# Patient Record
Sex: Female | Born: 1940 | Race: White | Hispanic: No | Marital: Married | Smoking: Never smoker
Health system: Southern US, Community
[De-identification: ages and names within clinical notes are randomized; demographics above are authoritative.]

## PROBLEM LIST (undated history)

## (undated) DIAGNOSIS — R569 Unspecified convulsions: Secondary | ICD-10-CM

## (undated) DIAGNOSIS — F0390 Unspecified dementia without behavioral disturbance: Secondary | ICD-10-CM

## (undated) HISTORY — DX: Unspecified dementia, unspecified severity, without behavioral disturbance, psychotic disturbance, mood disturbance, and anxiety: F03.90

## (undated) HISTORY — PX: BACK SURGERY: SHX140

---

## 2002-11-28 ENCOUNTER — Emergency Department (HOSPITAL_COMMUNITY): Admission: EM | Admit: 2002-11-28 | Discharge: 2002-11-28 | Payer: Self-pay | Admitting: Emergency Medicine

## 2010-09-18 ENCOUNTER — Encounter: Payer: Self-pay | Admitting: Internal Medicine

## 2016-08-14 ENCOUNTER — Ambulatory Visit (INDEPENDENT_AMBULATORY_CARE_PROVIDER_SITE_OTHER): Payer: Medicare HMO | Admitting: Neurology

## 2016-08-14 ENCOUNTER — Encounter: Payer: Self-pay | Admitting: Neurology

## 2016-08-14 VITALS — BP 129/65 | HR 79 | Ht 64.0 in | Wt 149.2 lb

## 2016-08-14 DIAGNOSIS — R410 Disorientation, unspecified: Secondary | ICD-10-CM

## 2016-08-14 DIAGNOSIS — E538 Deficiency of other specified B group vitamins: Secondary | ICD-10-CM

## 2016-08-14 DIAGNOSIS — F0281 Dementia in other diseases classified elsewhere with behavioral disturbance: Secondary | ICD-10-CM | POA: Diagnosis not present

## 2016-08-14 DIAGNOSIS — R413 Other amnesia: Secondary | ICD-10-CM

## 2016-08-14 DIAGNOSIS — F0391 Unspecified dementia with behavioral disturbance: Secondary | ICD-10-CM | POA: Diagnosis not present

## 2016-08-14 DIAGNOSIS — F05 Delirium due to known physiological condition: Secondary | ICD-10-CM | POA: Diagnosis not present

## 2016-08-14 DIAGNOSIS — G301 Alzheimer's disease with late onset: Secondary | ICD-10-CM | POA: Diagnosis not present

## 2016-08-14 MED ORDER — ALPRAZOLAM 0.5 MG PO TABS: ORAL_TABLET | ORAL | 0 refills | Status: DC

## 2016-08-14 MED ORDER — DONEPEZIL HCL 10 MG PO TABS: 10.0000 mg | ORAL_TABLET | Freq: Every day | ORAL | 6 refills | Status: DC

## 2016-08-14 NOTE — Patient Instructions (Addendum)
Remember to drink plenty of fluid, eat healthy meals and do not skip any meals. Try to eat protein with a every meal and eat a healthy snack such as fruit or nuts in between meals. Try to keep a regular sleep-wake schedule and try to exercise daily, particularly in the form of walking, 20-30 minutes a day, if you can.   As far as your medications are concerned, I would like to suggest: Aricept. Start with 1/2 tablet an dthen ibcrease to a whole tablet in 1 week if no side effects  As far as diagnostic testing: Labs, MRI brain  I would like to see you back in 4 months, sooner if we need to. Please call us with any interim questions, concerns, problems, updates or refill requests.   Our phone number is [phone]. We also have an after hours call service for urgent matters and there is a physician on-call for urgent questions. For any emergencies you know to call 911 or go to the nearest emergency room  Donepezil tablets What is this medicine? DONEPEZIL (doe NEP e zil) is used to treat mild to moderate dementia caused by Alzheimer's disease. This medicine may be used for other purposes; ask your health care provider or pharmacist if you have questions. COMMON BRAND NAME(S): Aricept What should I tell my health care provider before I take this medicine? They need to know if you have any of these conditions: -asthma or other lung disease -difficulty passing urine -head injury -heart disease -history of irregular heartbeat -liver disease -seizures (convulsions) -stomach or intestinal disease, ulcers or stomach bleeding -an unusual or allergic reaction to donepezil, other medicines, foods, dyes, or preservatives -pregnant or trying to get pregnant -breast-feeding How should I use this medicine? Take this medicine by mouth with a glass of water. Follow the directions on the prescription label. You may take this medicine with or without food. Take this medicine at regular intervals. This  medicine is usually taken before bedtime. Do not take it more often than directed. Continue to take your medicine even if you feel better. Do not stop taking except on your doctor's advice. If you are taking the 23 mg donepezil tablet, swallow it whole; do not cut, crush, or chew it. Talk to your pediatrician regarding the use of this medicine in children. Special care may be needed. Overdosage: If you think you have taken too much of this medicine contact a poison control center or emergency room at once. NOTE: This medicine is only for you. Do not share this medicine with others. What if I miss a dose? If you miss a dose, take it as soon as you can. If it is almost time for your next dose, take only that dose, do not take double or extra doses. What may interact with this medicine? Do not take this medicine with any of the following medications: -certain medicines for fungal infections like itraconazole, fluconazole, posaconazole, and voriconazole -cisapride -dextromethorphan; quinidine -dofetilide -dronedarone -pimozide -quinidine -thioridazine -ziprasidone This medicine may also interact with the following medications: -antihistamines for allergy, cough and cold -atropine -bethanechol -carbamazepine -certain medicines for bladder problems like oxybutynin, tolterodine -certain medicines for Parkinson's disease like benztropine, trihexyphenidyl -certain medicines for stomach problems like dicyclomine, hyoscyamine -certain medicines for travel sickness like scopolamine -dexamethasone -ipratropium -NSAIDs, medicines for pain and inflammation, like ibuprofen or naproxen -other medicines for Alzheimer's disease -other medicines that prolong the QT interval (cause an abnormal heart rhythm) -phenobarbital -phenytoin -rifampin, rifabutin or rifapentine This list may  not describe all possible interactions. Give your health care provider a list of all the medicines, herbs,  non-prescription drugs, or dietary supplements you use. Also tell them if you smoke, drink alcohol, or use illegal drugs. Some items may interact with your medicine. What should I watch for while using this medicine? Visit your doctor or health care professional for regular checks on your progress. Check with your doctor or health care professional if your symptoms do not get better or if they get worse. You may get drowsy or dizzy. Do not drive, use machinery, or do anything that needs mental alertness until you know how this drug affects you. What side effects may I notice from receiving this medicine? Side effects that you should report to your doctor or health care professional as soon as possible: -allergic reactions like skin rash, itching or hives, swelling of the face, lips, or tongue -feeling faint or lightheaded, falls -loss of bladder control -seizures -signs and symptoms of a dangerous change in heartbeat or heart rhythm like chest pain; dizziness; fast or irregular heartbeat; palpitations; feeling faint or lightheaded, falls; breathing problems -signs and symptoms of infection like fever or chills; cough; sore throat; pain or trouble passing urine -signs and symptoms of liver injury like dark yellow or brown urine; general ill feeling or flu-like symptoms; light-colored stools; loss of appetite; nausea; right upper belly pain; unusually weak or tired; yellowing of the eyes or skin -slow heartbeat or palpitations -unusual bleeding or bruising -vomiting Side effects that usually do not require medical attention (report to your doctor or health care professional if they continue or are bothersome): -diarrhea, especially when starting treatment -headache -loss of appetite -muscle cramps -nausea -stomach upset This list may not describe all possible side effects. Call your doctor for medical advice about side effects. You may report side effects to FDA at 1-800-FDA-1088. Where should  I keep my medicine? Keep out of reach of children. Store at room temperature between 15 and 30 degrees C (59 and 86 degrees F). Throw away any unused medicine after the expiration date. NOTE: This sheet is a summary. It may not cover all possible information. If you have questions about this medicine, talk to your doctor, pharmacist, or health care provider.  2017 Elsevier/Gold Standard (2016-01-31 21:00:42)

## 2016-08-14 NOTE — Progress Notes (Signed)
[facility]    Provider:  Dr Lucia GaskinsAhern Referring Provider: Charlies Silversouillard, [name], PA* Primary Care Physician:  Bertram Savinouillard, [name] L, PA-C  CC: Dementia  HPI:  Mary Lynch is a 75 y.o. female here as a referral from Dr. Tereso Newcomerouillard for memory changes. Memory loss ongoing for years. She sees a psychiatrist (Dr. Donell BeersPlovsky) who is prescribing celexa for agitation. Can perform her own ADLs. She has had vitamin B12 deficiency and was getting shots. She gets a multivitamin now but no B12 supplementation. Patient is agitated and upset today. Husband provides most information. Memory changes started years ago and slowly progressive. She has gotten lost in the past. She got lost several years ago. It took hours to find her, she didn't know where she was he was looking for her for 12 hours, she ended up in [place]. She does not drive anymore. It started with losing things, she was buying a lot of canned goods of the same thing, forgetting conversations. Now she does not remember conversations or appointments, she repeats things, she does not remember her children's birthdays, difficult conversation today patient is upset. She used to do the bills but now her husband does the bills. She has hallucinations possible, she saw someone in the house and says she sees things that are not there. No delusions but very irritable and angry, she sees Dr. Donell BeersPlovsky. She refuses labs today and MRI, stamps her foot. Father with Alzheimers. Performs ADLs but cannot perform IADLs independently. Husband provides all information  Reviewed notes, labs and imaging from outside physicians, which showed:  Reviewed pcp notes:  Patient's husband stated she forgets things and constantly thinks and talks about her family members who have passed away. Patient [facility] who is prescribing Celexa for agitation. He stated in his note that memory issue has been going on for years and is getting  worse. Can perform her own ADL's. Patient is married and her husband notices her memory is getting worse. Her husband is getting more stressed out about it.   During the visit, patient became agitated and kept on saying "I refuse to have memory testing done". Patient was then calmed down by explaining the mini-mental exam. However, the agitation and refusing got worse again after she could not answer several questions on the mini-mental exam.   Mini-Mental State Exam  Years of School: High school  Date of Birth: 05/18/1941  Cognitive Function # Correct  Orientation (i.e. date, month, year, day, season, state, country, town, hospital, floor) (Maximum = 10) 2  Registration (i.e. Ball, flag, tree) (Maximum = 3) 0  Attention and Calculation (count backwards from 100 by 7 for 5 subtractions or spell the word "WORLD" backwards (Maximum = 5) 0  Recall (recall 3 previous words, i.e. Ball, flag, tree) (Maximum = 3) 0  Naming (i.e. Watch, pencil) (Maximum = 2) 2  Repetition (No, ifs, ands, or buts) (Maximum = 1) 1  Three-Stage Command (Takes, folds, puts) (Maximum = 3) 3  Reading (read and do) (Maximum = 1) Refused  Writing (write a sentence with subject & verb) (Maximum = 1) Refused  Copy (2 intersecting shapes) (Maximum = 1) Refused  Total Score (Maximum = 30): 8       Review of Systems: Patient complains of symptoms per HPI as well as the following symptoms: memory loss, confusion. Pertinent negatives per HPI. All others negative.   Social History   Social History  . Marital status: Married    Spouse name: N/A  .  Number of children: 2  . Years of education: 12   Occupational History  . retired    Social History Main Topics  . Smoking status: Never Smoker  . Smokeless tobacco: Never Used  . Alcohol use No  . Drug use: No  . Sexual activity: Not on file   Other Topics Concern  . Not on file   Social History Narrative   Lives with husband   Caffeine use: Coffee/tea/soda daily     Family History  Problem Relation Age of Onset  . Alzheimer's disease Father     Past Medical History:  Diagnosis Date  . Dementia     Past Surgical History:  Procedure Laterality Date  . BACK SURGERY      Current Outpatient Prescriptions  Medication Sig Dispense Refill  . citalopram (CELEXA) 20 MG tablet Take 20 mg by mouth daily.    . Multiple Vitamin (MULTIVITAMIN) capsule Take 1 capsule by mouth daily.    Marland Kitchen. ALPRAZolam (XANAX) 0.5 MG tablet Take 1-2 tablets 30-60 minutes before MRI. May take an extra pill if needed while in MRI. Do not drive. 3 tablet 0  . donepezil (ARICEPT) 10 MG tablet Take 1 tablet (10 mg total) by mouth at bedtime. 30 tablet 6   No current facility-administered medications for this visit.     Allergies as of 08/14/2016 - Review Complete 08/14/2016  Allergen Reaction Noted  . Penicillin g Rash 07/18/2016    Vitals: BP 129/65 (BP Location: Right Arm, Patient Position: Sitting, Cuff Size: Normal)   Pulse 79   Ht 5\' 4"  (1.626 m)   Wt 149 lb 3.2 oz (67.7 kg)   BMI 25.61 kg/m  Last Weight:  Wt Readings from Last 1 Encounters:  08/14/16 149 lb 3.2 oz (67.7 kg)   Last Height:   Ht Readings from Last 1 Encounters:  08/14/16 5\' 4"  (1.626 m)    Physical exam:  Exam: Gen: NAD, agitated               CV: RRR, no MRG. No Carotid Bruits. No peripheral edema, warm, nontender Eyes: Conjunctivae clear without exudates or hemorrhage  Neuro: Detailed Neurologic Exam  Speech:    Not aphasic or dysarthric Cognition:  MMSE - Mini Mental State Exam 08/14/2016  Orientation to time 1  Orientation to Place 2  Registration 3  Attention/ Calculation 0  Recall 0  Language- name 2 objects 2  Language- repeat 1  Language- follow 3 step command 1  Language- read & follow direction 1  Write a sentence 0  Copy design 0  Total score 11      The patient is oriented to person    recent and remote memory impaired;     language fluent;     Impaired  attention, concentration,  fund of knowledge Cranial Nerves:    The pupils are equal, round, and reactive to light. Could not visualize fundi due to patient non cooperation.. Visual fields are full to finger confrontation. Extraocular movements are intact. Trigeminal sensation is intact and the muscles of mastication are normal. The face is symmetric. The palate elevates in the midline. Hearing intact. Voice is normal. Shoulder shrug is normal. The tongue has normal motion without fasciculations.   Coordination:    Normal finger to nose and heel to shin. Normal rapid alternating movements.   Gait:    Heel-toe and tandem gait are normal.   Motor Observation:    No asymmetry, no atrophy, and no involuntary movements  noted. Tone:    Normal muscle tone.    Posture:    Posture is normal. normal erect    Strength:    Strength is symmetrical in the upper and lower limbs without apparent deficits     Sensation: intact to LT     Reflex Exam:  DTR's:    Deep tendon reflexes in the upper and lower extremities are symmetrical bilaterally.   Toes:    The toes are equivocal bilaterally.   Clonus:    Clonus is absent.      Assessment/Plan:  75 year old with dementia with behavioral disturbances. MMSE 11/30.  Mri brain to evaluate for causes of memory loss and reversible causes of dementia, strokes Labs today: b12, tsh, hiv, rpr, cbc, cmp Start Aricept, discussed side effects as per patient instructions At next appointment start namenda  Today's history and physical demonstrated very substantial and measurable cognitive losses consistent with dementia. Based on the the substantial degree of impairment is clear that she does not have the capacity to make an informed and appropriate decisions on her healthcare and finances. I do recommend that she lives in a structured setting. This also clear that patient does not comprehend the degree of cognitive losses. Patient is suffering from  substantial cognitive impairment due to dementia. Discussed dementia, provided written materials and information such as to Alzheimer's organizations.    Mary Dean, MD  [facility] [address] [place], [place] [address]  Phone [phone] Fax [phone]

## 2016-08-15 ENCOUNTER — Telehealth: Payer: Self-pay | Admitting: *Deleted

## 2016-08-15 LAB — COMPREHENSIVE METABOLIC PANEL
ALBUMIN: 4.8 g/dL (ref 3.5–4.8)
ALK PHOS: 82 IU/L (ref 39–117)
ALT: 18 IU/L (ref 0–32)
AST: 23 IU/L (ref 0–40)
Albumin/Globulin Ratio: 2.3 — ABNORMAL HIGH (ref 1.2–2.2)
BILIRUBIN TOTAL: 0.6 mg/dL (ref 0.0–1.2)
BUN / CREAT RATIO: 19 (ref 12–28)
BUN: 16 mg/dL (ref 8–27)
CHLORIDE: 98 mmol/L (ref 96–106)
CO2: 23 mmol/L (ref 18–29)
Calcium: 9.7 mg/dL (ref 8.7–10.3)
Creatinine, Ser: 0.83 mg/dL (ref 0.57–1.00)
GFR calc Af Amer: 79 mL/min/{1.73_m2} (ref >59)
GFR calc non Af Amer: 69 mL/min/{1.73_m2} (ref >59)
GLUCOSE: 84 mg/dL (ref 65–99)
Globulin, Total: 2.1 g/dL (ref 1.5–4.5)
Potassium: 3.6 mmol/L (ref 3.5–5.2)
Sodium: 142 mmol/L (ref 134–144)
Total Protein: 6.9 g/dL (ref 6.0–8.5)

## 2016-08-15 LAB — CBC
Hematocrit: 45.1 % (ref 34.0–46.6)
Hemoglobin: 14.7 g/dL (ref 11.1–15.9)
MCH: 31.3 pg (ref 26.6–33.0)
MCHC: 32.6 g/dL (ref 31.5–35.7)
MCV: 96 fL (ref 79–97)
PLATELETS: 230 10*3/uL (ref 150–379)
RBC: 4.69 x10E6/uL (ref 3.77–5.28)
RDW: 13.4 % (ref 12.3–15.4)
WBC: 6 10*3/uL (ref 3.4–10.8)

## 2016-08-15 LAB — B12 AND FOLATE PANEL: Folate: >20 ng/mL (ref >3.0)

## 2016-08-15 LAB — TSH: TSH: 2.88 u[IU]/mL (ref 0.450–4.500)

## 2016-08-15 LAB — RPR: RPR Ser Ql: NONREACTIVE

## 2016-08-15 LAB — HIV ANTIBODY (ROUTINE TESTING W REFLEX): HIV SCREEN 4TH GENERATION: NONREACTIVE

## 2016-08-15 NOTE — Telephone Encounter (Signed)
-----   Message from Anson FretAntonia B Ahern, MD sent at 08/15/2016  5:13 PM EST ----- Labs unremarkable thanks

## 2016-08-15 NOTE — Telephone Encounter (Signed)
Called and spoke to pt husband about unremarkable labs per AA,MD note. He verbalized understanding.

## 2016-08-16 ENCOUNTER — Encounter: Payer: Self-pay | Admitting: Neurology

## 2016-08-16 DIAGNOSIS — F0281 Dementia in other diseases classified elsewhere with behavioral disturbance: Secondary | ICD-10-CM | POA: Insufficient documentation

## 2016-08-16 DIAGNOSIS — F02818 Dementia in other diseases classified elsewhere, unspecified severity, with other behavioral disturbance: Secondary | ICD-10-CM | POA: Insufficient documentation

## 2016-08-16 DIAGNOSIS — G301 Alzheimer's disease with late onset: Secondary | ICD-10-CM

## 2016-09-07 ENCOUNTER — Ambulatory Visit
Admission: RE | Admit: 2016-09-07 | Discharge: 2016-09-07 | Disposition: A | Discharge status: Home / Self Care | Payer: Medicare HMO | Source: Ambulatory Visit | Attending: Neurology | Admitting: Neurology

## 2016-09-07 DIAGNOSIS — F0391 Unspecified dementia with behavioral disturbance: Secondary | ICD-10-CM

## 2016-09-07 DIAGNOSIS — F05 Delirium due to known physiological condition: Secondary | ICD-10-CM

## 2016-09-07 DIAGNOSIS — R413 Other amnesia: Secondary | ICD-10-CM

## 2016-09-07 DIAGNOSIS — R410 Disorientation, unspecified: Secondary | ICD-10-CM

## 2016-09-07 DIAGNOSIS — E538 Deficiency of other specified B group vitamins: Secondary | ICD-10-CM

## 2016-12-14 ENCOUNTER — Ambulatory Visit: Payer: Medicare HMO | Admitting: Adult Health

## 2017-04-16 ENCOUNTER — Emergency Department (HOSPITAL_COMMUNITY)
Admission: EM | Admit: 2017-04-16 | Discharge: 2017-04-16 | Disposition: A | Discharge status: Home Health | Payer: Medicare HMO | Attending: Emergency Medicine | Admitting: Emergency Medicine

## 2017-04-16 DIAGNOSIS — Z79899 Other long term (current) drug therapy: Secondary | ICD-10-CM | POA: Insufficient documentation

## 2017-04-16 DIAGNOSIS — N3000 Acute cystitis without hematuria: Secondary | ICD-10-CM

## 2017-04-16 DIAGNOSIS — F0281 Dementia in other diseases classified elsewhere with behavioral disturbance: Secondary | ICD-10-CM | POA: Diagnosis not present

## 2017-04-16 DIAGNOSIS — G309 Alzheimer's disease, unspecified: Secondary | ICD-10-CM | POA: Insufficient documentation

## 2017-04-16 DIAGNOSIS — R410 Disorientation, unspecified: Secondary | ICD-10-CM | POA: Diagnosis present

## 2017-04-16 LAB — BASIC METABOLIC PANEL
Anion gap: 14 (ref 5–15)
BUN: 16 mg/dL (ref 6–20)
CO2: 20 mmol/L — ABNORMAL LOW (ref 22–32)
CREATININE: 1.1 mg/dL — AB (ref 0.44–1.00)
Calcium: 9.2 mg/dL (ref 8.9–10.3)
Chloride: 102 mmol/L (ref 101–111)
GFR calc Af Amer: 55 mL/min — ABNORMAL LOW (ref >60)
GFR, EST NON AFRICAN AMERICAN: 47 mL/min — AB (ref >60)
Glucose, Bld: 97 mg/dL (ref 65–99)
POTASSIUM: 3.6 mmol/L (ref 3.5–5.1)
Sodium: 136 mmol/L (ref 135–145)

## 2017-04-16 LAB — URINALYSIS, ROUTINE W REFLEX MICROSCOPIC
BILIRUBIN URINE: NEGATIVE
Glucose, UA: NEGATIVE mg/dL
Ketones, ur: 5 mg/dL — AB
Nitrite: NEGATIVE
PH: 7 (ref 5.0–8.0)
Protein, ur: 30 mg/dL — AB
SPECIFIC GRAVITY, URINE: 1.019 (ref 1.005–1.030)

## 2017-04-16 LAB — CBC WITH DIFFERENTIAL/PLATELET
Basophils Absolute: 0 10*3/uL (ref 0.0–0.1)
Basophils Relative: 1 %
EOS ABS: 0 10*3/uL (ref 0.0–0.7)
Eosinophils Relative: 1 %
HCT: 44.7 % (ref 36.0–46.0)
HEMOGLOBIN: 15.2 g/dL — AB (ref 12.0–15.0)
Lymphocytes Relative: 14 %
Lymphs Abs: 0.8 10*3/uL (ref 0.7–4.0)
MCH: 31.7 pg (ref 26.0–34.0)
MCHC: 34 g/dL (ref 30.0–36.0)
MCV: 93.3 fL (ref 78.0–100.0)
Monocytes Absolute: 0.4 10*3/uL (ref 0.1–1.0)
Monocytes Relative: 7 %
NEUTROS ABS: 4.7 10*3/uL (ref 1.7–7.7)
Neutrophils Relative %: 77 %
PLATELETS: 211 10*3/uL (ref 150–400)
RBC: 4.79 MIL/uL (ref 3.87–5.11)
RDW: 12.6 % (ref 11.5–15.5)
WBC: 6 10*3/uL (ref 4.0–10.5)

## 2017-04-16 MED ORDER — SULFAMETHOXAZOLE-TRIMETHOPRIM 800-160 MG PO TABS: 1.0000 | ORAL_TABLET | Freq: Two times a day (BID) | ORAL | 0 refills | Status: DC

## 2017-04-16 NOTE — ED Triage Notes (Signed)
Per EMS husband states that over the past few days she has become more and more combative.  Pt soiled herself this morning and put her pants back on inside out.  Husband tried to change clothes and she became combative with him and would not let him change her.  EMS was unable to get vital signs due to pt not wanting them to be taken.

## 2017-04-16 NOTE — ED Notes (Signed)
Waiting for family to arrive to receive information for triage

## 2017-04-16 NOTE — Discharge Instructions (Signed)
Your primary provider will see you tomorrow at 3:20 pm to assist in arranging placement for ongoing care with this progressing dementia.

## 2017-04-16 NOTE — ED Provider Notes (Signed)
[facility] DEPT Provider Note   CSN: [id number] Arrival date & time: 04/16/17  1422     History   Chief Complaint Chief Complaint  Patient presents with  . Dementia    HPI Mary Lynch is a 76 y.o. female with a history of advancing alzheimers dementia, initially diagnosed about 5 years ago and cared for by her husband, presenting with increasing confusion and combativeness with any attempt to care for or correct her.  Her husband and a son at the bedside endorses that she is no longer safe in their home the husband can no longer care for her and are desirous of placement for her.  Her disruptive and combative behavior has escalated in the past several days. Husband has attempted arranging in home care in the recent past but was turned down as there was not a provider who felt comfortable caring for her in the home.  Family endorses having significant sundowners symptoms.  Husband also has had difficulty getting her to eat and has not had any PO intake today.  She has been seen at [facility] in [place], last seen 12/17, but this practice has had significant turnover and the last doctor that saw her is no longer there. She has also been seen by psychiatrist Dr Mary Lynch who most recently been her prescriber of her celexa and aricept.  The history is provided by the spouse and a relative.    Past Medical History:  Diagnosis Date  . Dementia     Patient Active Problem List   Diagnosis Date Noted  . Alzheimer's dementia, late onset, with behavioral disturbance 08/16/2016    Past Surgical History:  Procedure Laterality Date  . BACK SURGERY      OB History    No data available       Home Medications    Prior to Admission medications   Medication Sig Start Date End Date Taking? Authorizing Provider  citalopram (CELEXA) 20 MG tablet Take 20 mg by mouth daily. 08/01/16  Yes [provider]  cyanocobalamin (TH VITAMIN B12) 100 MCG tablet Take 100 mcg by mouth  daily.   Yes [provider]  Multiple Vitamin (MULTIVITAMIN) capsule Take 1 capsule by mouth daily.   Yes [provider]    Family History Family History  Problem Relation Age of Onset  . Alzheimer's disease Father     Social History Social History  Substance Use Topics  . Smoking status: Never Smoker  . Smokeless tobacco: Never Used  . Alcohol use No     Allergies   Penicillin g   Review of Systems Review of Systems  Constitutional: Positive for appetite change.       ROS otherwise unable to obtain given pt's dementia.  Gastrointestinal: Negative for diarrhea and vomiting.  Psychiatric/Behavioral: Positive for agitation, behavioral problems and confusion. Negative for self-injury.     Physical Exam Updated Vital Signs BP 126/76 (BP Location: Left Arm)   Pulse 96   Temp 98.9 F (37.2 C) (Oral)   Resp 18   Ht 5\' 5"  (1.651 m)   Wt 68 kg (150 lb)   SpO2 100%   BMI 24.96 kg/m   Physical Exam  Constitutional: She appears well-developed and well-nourished.  HENT:  Head: Normocephalic and atraumatic.  Eyes: Conjunctivae are normal.  Neck: Normal range of motion.  Cardiovascular: Normal rate, regular rhythm, normal heart sounds and intact distal pulses.   Pulmonary/Chest: Effort normal and breath sounds normal. She has no wheezes.  Abdominal:  Soft. Bowel sounds are normal. There is no tenderness. There is no guarding.  Musculoskeletal: Normal range of motion.  Neurological: She is alert. She has normal strength. She is not disoriented. No sensory deficit. Gait normal.  Skin: Skin is warm and dry.  Psychiatric: Her affect is labile. She is agitated. Cognition and memory are impaired. She expresses impulsivity. She is inattentive.  Nursing note and vitals reviewed.    ED Treatments / Results  Labs (all labs ordered are listed, but only abnormal results are displayed) Labs Reviewed  CBC WITH DIFFERENTIAL/PLATELET - Abnormal; Notable for the  following:       Result Value   Hemoglobin 15.2 (*)    All other components within normal limits  BASIC METABOLIC PANEL - Abnormal; Notable for the following:    CO2 20 (*)    Creatinine, Ser 1.10 (*)    GFR calc non Af Amer 47 (*)    GFR calc Af Amer 55 (*)    All other components within normal limits  URINALYSIS, ROUTINE W REFLEX MICROSCOPIC    EKG  EKG Interpretation None       Radiology No results found.  Procedures Procedures (including critical care time)  Medications Ordered in ED Medications - No data to display   Initial Impression / Assessment and Plan / ED Course  I have reviewed the triage vital signs and the nursing notes.  Pertinent labs & imaging results that were available during my care of the patient were reviewed by me and considered in my medical decision making (see chart for details).     Discussed patient with our social worker who will see patient and family and help with community resources and other information helpful for placing this patient. Discussed with family that we can only admit here for a medical concern and nursing home placement will need to be addressed by pcp.  Call placed to pcp and appt for her to be seen by her primary provided arranged for 3:20 pm tomorrow.  Family agreeable to this plan.   Pending UA currently.  Will plan dispo home once this results.  Discussed with Dr. Lynelle Doctor who assumes her care.  Final Clinical Impressions(s) / ED Diagnoses   Final diagnoses:  Alzheimer's dementia with behavioral disturbance, unspecified timing of dementia onset    New Prescriptions New Prescriptions   No medications on file     Mary Lynch 04/16/17 1731    Mary Dibbles, MD 04/16/17 2315

## 2017-04-17 NOTE — Care Management (Signed)
CM received message from [facility], CSW. CSW saw pt/family in ED on 04/16/2017. CM left message pt needs HH set up. CM reviewed chart, no HH orders placed by EDP. Pt has appointment with PCP today. CM contacted pt's husband and notified him no orders were placed and referral should be done from PCP office. CM contacted PCP's office ([facility]) to verify appointment for this afternoon and left message for [facility] to be arranged if warranted at time of appointment.

## 2017-04-19 LAB — URINE CULTURE: Culture: >=100000 — AB

## 2017-04-20 ENCOUNTER — Telehealth: Payer: Self-pay | Admitting: *Deleted

## 2017-04-20 NOTE — Telephone Encounter (Signed)
Post ED Visit - Positive Culture Follow-up  Culture report reviewed by antimicrobial stewardship pharmacist:  []  Enzo Bi, Pharm.D. []  Celedonio Miyamoto, Pharm.D., BCPS AQ-ID []  Garvin Fila, Pharm.D., BCPS []  Georgina Pillion, Pharm.D., BCPS []  [place], [address].D., BCPS, AAHIVP []  Estella Husk, Pharm.D., BCPS, AAHIVP []  Lysle Pearl, PharmD, BCPS []  Casilda Carls, PharmD, BCPS []  Pollyann Samples, PharmD, BCPS Sharin Mons, PharmD  Positive urine culture Treated with Sulfamethoxazole-trimethoprim, organism sensitive to the same and no further patient follow-up is required at this time.  Virl Axe [facility] 04/20/2017, 10:20 AM

## 2017-09-14 ENCOUNTER — Telehealth: Payer: Self-pay | Admitting: Neurology

## 2017-09-16 NOTE — Telephone Encounter (Signed)
Patient has not been seen in over a year. She is having acute anxiety. She sees Dr. Donell BeersPlovsky for her anxiety, needs to call him and follow up with us in the office.

## 2018-11-25 ENCOUNTER — Emergency Department (HOSPITAL_COMMUNITY)
Admission: EM | Admit: 2018-11-25 | Discharge: 2018-11-26 | Disposition: A | Discharge status: Other Acute Inpatient Hospital | Payer: Medicare HMO | Attending: Emergency Medicine | Admitting: Emergency Medicine

## 2018-11-25 ENCOUNTER — Encounter (HOSPITAL_COMMUNITY): Payer: Self-pay | Admitting: Emergency Medicine

## 2018-11-25 ENCOUNTER — Emergency Department (HOSPITAL_COMMUNITY): Payer: Medicare HMO

## 2018-11-25 ENCOUNTER — Other Ambulatory Visit: Payer: Self-pay

## 2018-11-25 DIAGNOSIS — S0181XA Laceration without foreign body of other part of head, initial encounter: Secondary | ICD-10-CM | POA: Diagnosis not present

## 2018-11-25 DIAGNOSIS — Y999 Unspecified external cause status: Secondary | ICD-10-CM | POA: Diagnosis not present

## 2018-11-25 DIAGNOSIS — S0990XA Unspecified injury of head, initial encounter: Secondary | ICD-10-CM | POA: Diagnosis present

## 2018-11-25 DIAGNOSIS — Y939 Activity, unspecified: Secondary | ICD-10-CM | POA: Insufficient documentation

## 2018-11-25 DIAGNOSIS — Z7984 Long term (current) use of oral hypoglycemic drugs: Secondary | ICD-10-CM | POA: Insufficient documentation

## 2018-11-25 DIAGNOSIS — G309 Alzheimer's disease, unspecified: Secondary | ICD-10-CM | POA: Insufficient documentation

## 2018-11-25 DIAGNOSIS — F0390 Unspecified dementia without behavioral disturbance: Secondary | ICD-10-CM | POA: Diagnosis not present

## 2018-11-25 DIAGNOSIS — W19XXXA Unspecified fall, initial encounter: Secondary | ICD-10-CM | POA: Insufficient documentation

## 2018-11-25 DIAGNOSIS — Y92239 Unspecified place in hospital as the place of occurrence of the external cause: Secondary | ICD-10-CM | POA: Diagnosis not present

## 2018-11-25 NOTE — ED Notes (Signed)
Ice pack applied to rt forehead.

## 2018-11-25 NOTE — ED Provider Notes (Signed)
[facility] EMERGENCY DEPARTMENT Provider Note   CSN: [id number] Arrival date & time: 11/25/18  2148    History   Chief Complaint Chief Complaint  Patient presents with   Fall    HPI Mary Lynch is a 78 y.o. female.     The history is provided by the patient. No language interpreter was used.  Fall  This is a new problem. The current episode started 1 to 2 hours ago. The problem occurs constantly. Nothing aggravates the symptoms. Nothing relieves the symptoms. She has tried nothing for the symptoms. The treatment provided no relief.  Pt is reported to have fallen and hit head.  No loc.  Pt hit head   Past Medical History:  Diagnosis Date   Dementia [facility])     Patient Active Problem List   Diagnosis Date Noted   Alzheimer's dementia, late onset, with behavioral disturbance (HCC) 08/16/2016    Past Surgical History:  Procedure Laterality Date   BACK SURGERY       OB History   No obstetric history on file.      Home Medications    Prior to Admission medications   Medication Sig Start Date End Date Taking? Authorizing Provider  citalopram (CELEXA) 20 MG tablet Take 20 mg by mouth daily. 08/01/16   [provider]  cyanocobalamin (TH VITAMIN B12) 100 MCG tablet Take 100 mcg by mouth daily.    [provider]  Multiple Vitamin (MULTIVITAMIN) capsule Take 1 capsule by mouth daily.    [provider]    Family History Family History  Problem Relation Age of Onset   Alzheimer's disease Father     Social History Social History   Tobacco Use   Smoking status: Never Smoker   Smokeless tobacco: Never Used  Substance Use Topics   Alcohol use: No   Drug use: No     Allergies   Penicillin g   Review of Systems Review of Systems  All other systems reviewed and are negative.    Physical Exam Updated Vital Signs BP 124/65 (BP Location: Left Arm)    Pulse 86    Temp 97.7 F (36.5 C) (Oral)    Resp 18    SpO2 96%    Physical Exam Vitals signs and nursing note reviewed.  Constitutional:      Appearance: She is well-developed.  HENT:     Head: Normocephalic.     Right Ear: Ear canal normal.     Left Ear: Ear canal normal.     Nose: Nose normal.     Mouth/Throat:     Mouth: Mucous membranes are moist.  Eyes:     Pupils: Pupils are equal, round, and reactive to light.  Neck:     Musculoskeletal: Normal range of motion.  Cardiovascular:     Rate and Rhythm: Normal rate.  Pulmonary:     Effort: Pulmonary effort is normal.  Abdominal:     General: There is no distension.  Musculoskeletal: Normal range of motion.  Skin:    Comments: 1cm laceration right forehead,  Large area of swelling   Neurological:     General: No focal deficit present.     Mental Status: She is alert.     Comments: confused  Psychiatric:        Mood and Affect: Mood normal.      ED Treatments / Results  Labs (all labs ordered are listed, but only abnormal results are displayed) Labs Reviewed - No  data to display  EKG None  Radiology Ct Head Wo Contrast  Result Date: 11/25/2018 CLINICAL DATA:  Fall today. RIGHT periorbital laceration and ataxia. EXAM: CT HEAD WITHOUT CONTRAST CT CERVICAL SPINE WITHOUT CONTRAST TECHNIQUE: Multidetector CT imaging of the head and cervical spine was performed following the standard protocol without intravenous contrast. Multiplanar CT image reconstructions of the cervical spine were also generated. COMPARISON:  None. FINDINGS: CT HEAD FINDINGS-mild motion degraded examination. BRAIN: No intraparenchymal hemorrhage, mass effect nor midline shift. Moderate to severe parenchymal brain volume loss for age. No hydrocephalus. Patchy supratentorial white matter hypodensities. Bilateral corona radiata subcentimeter calcification. No acute large vascular territory infarcts. No abnormal extra-axial fluid collections. Basal cisterns are patent. VASCULAR: Mild calcific atherosclerosis of the  carotid siphons. SKULL: No skull fracture. Severe temporomandibular osteoarthrosis. Large RIGHT periorbital/frontal scalp hematoma with minimal subcutaneous gas, no radiopaque foreign bodies. SINUSES/ORBITS: Trace paranasal sinus mucosal thickening. Mastoid air cells are well aerated.The included ocular globes and orbital contents are non-suspicious. OTHER: None. CT CERVICAL SPINE FINDINGS ALIGNMENT: Maintained lordosis. No malalignment. SKULL BASE AND VERTEBRAE: Cervical vertebral bodies and posterior elements are intact. Moderate to severe disc height loss C5-6 with endplate spurring and C6-7 compatible with degenerative discs, mild at C4-5. C1-2 articulation maintained with mild osteoarthrosis. No destructive bony lesions. Congenitally capacious canal. SOFT TISSUES AND SPINAL CANAL: Nonacute. DISC LEVELS: No high-grade osseous canal stenosis. Moderate to severe RIGHT C4-5 and C5-6 neural foraminal narrowing. UPPER CHEST: Lung apices are clear. OTHER: None. IMPRESSION: CT HEAD: 1. Mild motion degraded examination. No acute intracranial process. Large RIGHT frontal/periorbital scalp hematoma and laceration. 2. Moderate to severe parenchymal brain volume loss and chronic small vessel ischemic changes. CT CERVICAL SPINE: 1. No fracture or malalignment. 2. Moderate to severe RIGHT C4-5 and C5-6 neural foraminal narrowing. Electronically Signed   By: Awilda Metro M.D.   On: 11/25/2018 23:28   Ct Cervical Spine Wo Contrast  Result Date: 11/25/2018 CLINICAL DATA:  Fall today. RIGHT periorbital laceration and ataxia. EXAM: CT HEAD WITHOUT CONTRAST CT CERVICAL SPINE WITHOUT CONTRAST TECHNIQUE: Multidetector CT imaging of the head and cervical spine was performed following the standard protocol without intravenous contrast. Multiplanar CT image reconstructions of the cervical spine were also generated. COMPARISON:  None. FINDINGS: CT HEAD FINDINGS-mild motion degraded examination. BRAIN: No intraparenchymal  hemorrhage, mass effect nor midline shift. Moderate to severe parenchymal brain volume loss for age. No hydrocephalus. Patchy supratentorial white matter hypodensities. Bilateral corona radiata subcentimeter calcification. No acute large vascular territory infarcts. No abnormal extra-axial fluid collections. Basal cisterns are patent. VASCULAR: Mild calcific atherosclerosis of the carotid siphons. SKULL: No skull fracture. Severe temporomandibular osteoarthrosis. Large RIGHT periorbital/frontal scalp hematoma with minimal subcutaneous gas, no radiopaque foreign bodies. SINUSES/ORBITS: Trace paranasal sinus mucosal thickening. Mastoid air cells are well aerated.The included ocular globes and orbital contents are non-suspicious. OTHER: None. CT CERVICAL SPINE FINDINGS ALIGNMENT: Maintained lordosis. No malalignment. SKULL BASE AND VERTEBRAE: Cervical vertebral bodies and posterior elements are intact. Moderate to severe disc height loss C5-6 with endplate spurring and C6-7 compatible with degenerative discs, mild at C4-5. C1-2 articulation maintained with mild osteoarthrosis. No destructive bony lesions. Congenitally capacious canal. SOFT TISSUES AND SPINAL CANAL: Nonacute. DISC LEVELS: No high-grade osseous canal stenosis. Moderate to severe RIGHT C4-5 and C5-6 neural foraminal narrowing. UPPER CHEST: Lung apices are clear. OTHER: None. IMPRESSION: CT HEAD: 1. Mild motion degraded examination. No acute intracranial process. Large RIGHT frontal/periorbital scalp hematoma and laceration. 2. Moderate to severe parenchymal brain volume loss  and chronic small vessel ischemic changes. CT CERVICAL SPINE: 1. No fracture or malalignment. 2. Moderate to severe RIGHT C4-5 and C5-6 neural foraminal narrowing. Electronically Signed   By: Awilda Metro M.D.   On: 11/25/2018 23:28    Procedures .Marland KitchenLaceration Repair Date/Time: 11/25/2018 11:39 PM Performed by: Elson Areas, PA-C Authorized by: Elson Areas, PA-C    Consent:    Consent obtained:  Verbal   Consent given by:  Patient   Risks discussed:  Infection, need for additional repair, pain, poor cosmetic result and poor wound healing   Alternatives discussed:  No treatment and delayed treatment Universal protocol:    Procedure explained and questions answered to patient or proxy's satisfaction: yes     Relevant documents present and verified: yes     Test results available and properly labeled: yes     Imaging studies available: yes     Required blood products, implants, devices, and special equipment available: yes     Site/side marked: yes     Immediately prior to procedure, a time out was called: yes     Patient identity confirmed:  Verbally with patient Laceration details:    Location:  Face   Face location:  Forehead   Length (cm):  1   Depth (mm):  2 Repair type:    Repair type:  Simple Treatment:    Area cleansed with:  Betadine   Irrigation solution:  Sterile saline Skin repair:    Repair method:  Tissue adhesive Post-procedure details:    Patient tolerance of procedure:  Tolerated well, no immediate complications   (including critical care time)  Medications Ordered in ED Medications - No data to display   Initial Impression / Assessment and Plan / ED Course  I have reviewed the triage vital signs and the nursing notes.  Pertinent labs & imaging results that were available during my care of the patient were reviewed by me and considered in my medical decision making (see chart for details).        MDM  Ct head and c spine no acute abnormality.   Final Clinical Impressions(s) / ED Diagnoses   Final diagnoses:  Facial laceration, initial encounter    ED Discharge Orders    None    An After Visit Summary was printed and given to the patient.    Elson Areas, PA-C 11/25/18 2340    Samuel Jester, DO 11/27/18 1636

## 2018-11-25 NOTE — Discharge Instructions (Addendum)
Return if any problems.

## 2018-11-25 NOTE — ED Notes (Signed)
Pt refused ccollar. Refused to allow staff to replace collar. Continues to remove. Bleeding and increased swelling noted to rt eyebrow. Applied pressure with sterile gauze and wrapped with kerlex.

## 2018-11-25 NOTE — ED Triage Notes (Signed)
Pt here from [facility] s/p unwittnessed fall. Per EMS, pt has laceration above Rt eyebrow.

## 2018-11-26 NOTE — ED Notes (Signed)
Spoke with son Russella Dar and gave update on pt condition. Explained that pt had dermbond applied to laceration on rt forehead and also that pt would be d/ced back to [facility]. [facility] and informed of pt condition and that pt was ready to go back to [facility]. Staff stated that no one was available at this time to come to get patient but she would call the administrator to inform of the need to come get patient.

## 2018-11-26 NOTE — ED Notes (Signed)
[facility] contacted to inquire about when someone was to come get pt. Staff stated that someone was coming to get pt.

## 2021-02-11 ENCOUNTER — Emergency Department (HOSPITAL_COMMUNITY)
Admission: EM | Admit: 2021-02-11 | Discharge: 2021-02-11 | Disposition: A | Discharge status: Home / Self Care | Payer: Medicare Other | Attending: Emergency Medicine | Admitting: Emergency Medicine

## 2021-02-11 ENCOUNTER — Emergency Department (HOSPITAL_COMMUNITY): Payer: Medicare Other

## 2021-02-11 ENCOUNTER — Other Ambulatory Visit: Payer: Self-pay

## 2021-02-11 ENCOUNTER — Encounter (HOSPITAL_COMMUNITY): Payer: Self-pay

## 2021-02-11 DIAGNOSIS — R4182 Altered mental status, unspecified: Secondary | ICD-10-CM

## 2021-02-11 DIAGNOSIS — G253 Myoclonus: Secondary | ICD-10-CM

## 2021-02-11 DIAGNOSIS — Z79899 Other long term (current) drug therapy: Secondary | ICD-10-CM | POA: Insufficient documentation

## 2021-02-11 DIAGNOSIS — R569 Unspecified convulsions: Secondary | ICD-10-CM | POA: Diagnosis not present

## 2021-02-11 DIAGNOSIS — F0281 Dementia in other diseases classified elsewhere with behavioral disturbance: Secondary | ICD-10-CM | POA: Insufficient documentation

## 2021-02-11 DIAGNOSIS — X58XXXA Exposure to other specified factors, initial encounter: Secondary | ICD-10-CM | POA: Diagnosis not present

## 2021-02-11 DIAGNOSIS — S01551A Open bite of lip, initial encounter: Secondary | ICD-10-CM | POA: Insufficient documentation

## 2021-02-11 DIAGNOSIS — G309 Alzheimer's disease, unspecified: Secondary | ICD-10-CM | POA: Insufficient documentation

## 2021-02-11 LAB — URINALYSIS, ROUTINE W REFLEX MICROSCOPIC
Bilirubin Urine: NEGATIVE
Glucose, UA: NEGATIVE mg/dL
Ketones, ur: 5 mg/dL — AB
Leukocytes,Ua: NEGATIVE
Nitrite: NEGATIVE
Protein, ur: NEGATIVE mg/dL
RBC / HPF: >50 RBC/hpf — ABNORMAL HIGH (ref 0–5)
Specific Gravity, Urine: 1.014 (ref 1.005–1.030)
pH: 8 (ref 5.0–8.0)

## 2021-02-11 LAB — CBC WITH DIFFERENTIAL/PLATELET
Abs Immature Granulocytes: 0.01 10*3/uL (ref 0.00–0.07)
Basophils Absolute: 0 10*3/uL (ref 0.0–0.1)
Basophils Relative: 1 %
Eosinophils Absolute: 0 10*3/uL (ref 0.0–0.5)
Eosinophils Relative: 1 %
HCT: 43.2 % (ref 36.0–46.0)
Hemoglobin: 14.2 g/dL (ref 12.0–15.0)
Immature Granulocytes: 0 %
Lymphocytes Relative: 20 %
Lymphs Abs: 0.8 10*3/uL (ref 0.7–4.0)
MCH: 31.8 pg (ref 26.0–34.0)
MCHC: 32.9 g/dL (ref 30.0–36.0)
MCV: 96.6 fL (ref 80.0–100.0)
Monocytes Absolute: 0.2 10*3/uL (ref 0.1–1.0)
Monocytes Relative: 5 %
Neutro Abs: 3 10*3/uL (ref 1.7–7.7)
Neutrophils Relative %: 73 %
Platelets: 208 10*3/uL (ref 150–400)
RBC: 4.47 MIL/uL (ref 3.87–5.11)
RDW: 13.1 % (ref 11.5–15.5)
WBC: 4.1 10*3/uL (ref 4.0–10.5)
nRBC: 0 % (ref 0.0–0.2)

## 2021-02-11 LAB — COMPREHENSIVE METABOLIC PANEL
ALT: 19 U/L (ref 0–44)
AST: 26 U/L (ref 15–41)
Albumin: 3.8 g/dL (ref 3.5–5.0)
Alkaline Phosphatase: 72 U/L (ref 38–126)
Anion gap: 10 (ref 5–15)
BUN: 15 mg/dL (ref 8–23)
CO2: 27 mmol/L (ref 22–32)
Calcium: 9.2 mg/dL (ref 8.9–10.3)
Chloride: 104 mmol/L (ref 98–111)
Creatinine, Ser: 0.83 mg/dL (ref 0.44–1.00)
GFR, Estimated: >60 mL/min (ref >60)
Glucose, Bld: 107 mg/dL — ABNORMAL HIGH (ref 70–99)
Potassium: 3.6 mmol/L (ref 3.5–5.1)
Sodium: 141 mmol/L (ref 135–145)
Total Bilirubin: 0.6 mg/dL (ref 0.3–1.2)
Total Protein: 6.8 g/dL (ref 6.5–8.1)

## 2021-02-11 MED ORDER — LORAZEPAM 2 MG/ML IJ SOLN
INTRAMUSCULAR | Status: AC
  Filled 2021-02-11: qty 1

## 2021-02-11 MED ORDER — CLONAZEPAM 0.5 MG PO TABS: 0.5000 mg | ORAL_TABLET | Freq: Three times a day (TID) | ORAL | Status: DC

## 2021-02-11 MED ORDER — LORAZEPAM 2 MG/ML IJ SOLN
0.2500 mg | Freq: Once | INTRAMUSCULAR | Status: AC
  Administered 2021-02-11: 0.25 mg via INTRAVENOUS
  Filled 2021-02-11: qty 1

## 2021-02-11 MED ORDER — SODIUM CHLORIDE 0.9 % IV BOLUS
500.0000 mL | Freq: Once | INTRAVENOUS | Status: AC
  Administered 2021-02-11: 13:00:00 500 mL via INTRAVENOUS

## 2021-02-11 MED ORDER — LEVETIRACETAM IN NACL 1000 MG/100ML IV SOLN
1000.0000 mg | Freq: Once | INTRAVENOUS | Status: AC
  Administered 2021-02-11: 13:00:00 1000 mg via INTRAVENOUS
  Filled 2021-02-11: qty 100

## 2021-02-11 MED ORDER — CLONAZEPAM 0.5 MG PO TBDP: 0.5000 mg | ORAL_TABLET | Freq: Three times a day (TID) | ORAL | 0 refills | Status: AC

## 2021-02-11 MED ORDER — LEVETIRACETAM 500 MG PO TABS: 500.0000 mg | ORAL_TABLET | Freq: Two times a day (BID) | ORAL | Status: DC

## 2021-02-11 MED ORDER — CLONAZEPAM 0.5 MG PO TBDP: 0.5000 mg | ORAL_TABLET | Freq: Three times a day (TID) | ORAL | Status: DC

## 2021-02-11 MED ORDER — CLONAZEPAM 0.5 MG PO TABS
0.5000 mg | ORAL_TABLET | Freq: Once | ORAL | Status: DC
Start: 2021-02-11 — End: 2021-02-11
  Filled 2021-02-11: qty 1

## 2021-02-11 MED ORDER — LEVETIRACETAM 500 MG PO TABS: 500.0000 mg | ORAL_TABLET | Freq: Two times a day (BID) | ORAL | 0 refills | Status: AC

## 2021-02-11 NOTE — ED Notes (Signed)
Called EMS for transport back to [facility] in [place] as requested.

## 2021-02-11 NOTE — ED Triage Notes (Addendum)
Pt presents to ED from [facility] for witnessed seizure. Pt stays on the dementia ward, is under hospice care. Pt had seizure last week, has bruising to face from last week. Pt has new bite to lip today. CBG 126

## 2021-02-11 NOTE — ED Notes (Signed)
RN called Pts husband, who agreed to provide Pt transport back to [facility]. RN made multiple attempts to call report to [facility], however unable to make contact or leave HIPPA approved msg for anyone at facility. RN called SN[facility] Corporate Office as well with no resolution to reaching anyone at facility.

## 2021-02-11 NOTE — Discharge Instructions (Addendum)
Recommendations per neurology Start patient on 500 mg Keppra twice daily. If she experiences behavioral disturbances then consider transitioning to Depakote instead. Transition her to disintegrating form of Klonopin 0.5 mg TID instead of the regular tablet to help with compliance. Missing doses of Klonopin can lead to seizures. I am only able to write a prescription for this medication for 7 days since it is a controlled substance so she will need to have her pcp continue this prescription if deemed necessary. She will need to have an outpatient EEG completed for further workup of her seizures. She can follow up with the neurologist she has seen in the past. Alternatively, she has been given a referral to a new neurologist, Dr. Judithann Sheen. You will need to call the office to schedule an appointment for her for follow up within the next 2 weeks.  The remainder of her workup in the emergency department was very reassuring. The CT scan of her head did not show any acute abnormalities. She did not have any pneumonia on chest xray and she did not have any obvious urinary tract infection. A culture of her urine was sent for evaluation and the hospital will follow up if patient needs to change or continue antibiotics.  She should follow up with her primary care provider within the next 3-5 days and return to the emergency department for any new or worsening symptoms.

## 2021-02-11 NOTE — ED Provider Notes (Addendum)
[facility] Provider Note   CSN: [id number] Arrival date & time: 02/11/21  1038     History Chief Complaint  Patient presents with   Seizures    Mary Lynch is a 80 y.o. female.  HPI   80 y/o female with a h/o dementia who [facility] ED [facility] [place] for eval of seizure. Per EMS, pt had a seizure today and bit her lip. She reportedly had another seizure last week and has some bruising to the face from this. She is under hospice care at this time.   Level 5 caveat as pt has hx of dementia and is nonverbal on my exam.  11:10 AM Discussed case with Morrie SheldonAshley from pts SNF.  She states that the patient was sitting in her wheelchair, she extended her head backwards took a deep breath, leaned forward and then started having a generalized, tonic-clonic seizure.  She also bit her lip.  She states the patient has had some jerking episodes of her muscles for the last few days and has required increased care due to this.  She states the patient also had a seizure last week and was seen at an outside hospital at that time.  She reports the patient does not have a history of seizures.  Past Medical History:  Diagnosis Date   Dementia [facility](HCC)     Patient Active Problem List   Diagnosis Date Noted   Alzheimer's dementia, late onset, with behavioral disturbance (HCC) 08/16/2016    Past Surgical History:  Procedure Laterality Date   BACK SURGERY       OB History   No obstetric history on file.     Family History  Problem Relation Age of Onset   Alzheimer's disease Father     Social History   Tobacco Use   Smoking status: Never   Smokeless tobacco: Never  Substance Use Topics   Alcohol use: No   Drug use: No    Home Medications Prior to Admission medications   Medication Sig Start Date End Date Taking? Authorizing Provider  Amantadine HCl 100 MG tablet Take 150 mg by mouth in the morning and at bedtime. 02/10/21  Yes  [provider]  cephALEXin (KEFLEX) 500 MG capsule Take 500 mg by mouth 4 (four) times daily.   Yes [provider]  Cholecalciferol (VITAMIN D3) 10 MCG (400 UNIT) CAPS Take 1 capsule by mouth daily.   Yes [provider]  clonazePAM (KLONOPIN) 0.5 MG disintegrating tablet Take 1 tablet (0.5 mg total) by mouth 3 (three) times daily for 7 days. 02/11/21 02/18/21 Yes Kai Calico S, PA-C  donepezil (ARICEPT ODT) 10 MG disintegrating tablet Take 10 mg by mouth daily. 01/25/21  Yes [provider]  ergocalciferol (VITAMIN D2) 1.25 MG (50000 UT) capsule Take 50,000 Units by mouth once a week.   Yes [provider]  levETIRAcetam (KEPPRA) 500 MG tablet Take 1 tablet (500 mg total) by mouth 2 (two) times daily. 02/11/21 03/13/21 Yes Cono Gebhard S, PA-C  loratadine (CLARITIN) 10 MG tablet Take 10 mg by mouth daily as needed for allergies.   Yes [provider]  mirtazapine (REMERON) 15 MG tablet Take 15 mg by mouth at bedtime.   Yes [provider]  Multiple Vitamin (MULTIVITAMIN) capsule Take 1 capsule by mouth daily.   Yes [provider]  pyridoxine (B-6) 100 MG tablet Take 100 mg by mouth daily.   Yes [provider]  sertraline (ZOLOFT) 50 MG tablet Take 50 mg by mouth daily.   Yes [provider]    Allergies    Penicillin g  Review of Systems   Review of Systems  Unable to perform ROS: Dementia   Physical Exam Updated Vital Signs BP 121/65   Pulse 60   Temp 98.4 F (36.9 C) (Oral)   Resp 16   Ht 5\' 4"  (1.626 m)   Wt 54.4 kg   SpO2 97%   BMI 20.60 kg/m   Physical Exam Vitals and nursing note reviewed.  Constitutional:      General: She is not in acute distress.    Appearance: She is well-developed.  HENT:     Head: Normocephalic.     Comments: Old ecchymosis noted to the right side of the face/forehead. Old, scabbed wound noted to the mid forehead.     Mouth/Throat:     Comments:  Superficial cuts noted to the lower lip. No repairable laceration visualized. Eyes:     Conjunctiva/sclera: Conjunctivae normal.  Cardiovascular:     Rate and Rhythm: Normal rate and regular rhythm.     Heart sounds: No murmur heard. Pulmonary:     Effort: Pulmonary effort is normal. No respiratory distress.     Breath sounds: Normal breath sounds.  Abdominal:     General: Bowel sounds are normal.     Palpations: Abdomen is soft.     Tenderness: There is no abdominal tenderness.  Musculoskeletal:     Cervical back: Neck supple.  Skin:    General: Skin is warm and dry.  Neurological:     Mental Status: She is alert.     Comments: Alert, demented. Nonverbal. Pt with intermittent jerking movements. Does not follow commands but appears able to move all extremities.     ED Results / Procedures / Treatments   Labs (all labs ordered are listed, but only abnormal results are displayed) Labs Reviewed  COMPREHENSIVE METABOLIC PANEL - Abnormal; Notable for the following components:      Result Value   Glucose, Bld 107 (*)    All other components within normal limits  URINALYSIS, ROUTINE W REFLEX MICROSCOPIC - Abnormal; Notable for the following components:   APPearance HAZY (*)    Hgb urine dipstick MODERATE (*)    Ketones, ur 5 (*)    RBC / HPF >50 (*)    Bacteria, UA RARE (*)    All other components within normal limits  URINE CULTURE  CBC WITH DIFFERENTIAL/PLATELET    EKG None  Radiology CT Head Wo Contrast  Result Date: 02/11/2021 CLINICAL DATA:  Trauma. EXAM: CT HEAD WITHOUT CONTRAST CT MAXILLOFACIAL WITHOUT CONTRAST CT CERVICAL SPINE WITHOUT CONTRAST TECHNIQUE: Multidetector CT imaging of the head, cervical spine, and maxillofacial structures were performed using the standard protocol without intravenous contrast. Multiplanar CT image reconstructions of the cervical spine and maxillofacial structures were also generated. COMPARISON:  CT head November 25, 2018. FINDINGS: CT HEAD  FINDINGS Brain: No evidence of acute large vascular territory infarction, hemorrhage, hydrocephalus, extra-axial collection or mass lesion/mass effect. Moderate to severe patchy white matter hypoattenuation, likely related to chronic microvascular ischemic disease. Moderate to severe atrophy with ex vacuo ventricular dilation. Similar dystrophic calcification of bilateral frontal white matter. Vascular: No hyperdense vessel identified. Calcific atherosclerosis. Skull: No acute fracture. Other: Small bilateral mastoid effusions. CT MAXILLOFACIAL FINDINGS Osseous: No fracture or mandibular dislocation. No destructive process. Bilateral TMJ degenerative change. Orbits: Negative. No traumatic or inflammatory finding. Sinuses: Sinuses  are largely clear. There in area of nonspecific ossification within the posterior/inferior right maxillary sinus. Soft tissues: Negative. CT CERVICAL SPINE FINDINGS Alignment: Slightly exaggerated cervical lordosis without substantial sagittal subluxation. Broad levocurvature. Skull base and vertebrae: No evidence of acute fracture. Vertebral body heights are maintained. Soft tissues and spinal canal: No prevertebral fluid or swelling. No visible canal hematoma. Disc levels: Moderate multilevel degenerative disc disease with disc height loss, endplate sclerosis and spurring. Multilevel facet and uncovertebral hypertrophy with varying degrees of neural foraminal stenosis. Upper chest: Visualized lung apices are clear. IMPRESSION: CT head: 1. No evidence of acute intracranial abnormality. 2. Moderate to severe chronic microvascular disease and atrophy. CT maxillofacial: 1. No evidence of acute fracture. 2. Bilateral TMJ degenerative change. CT cervical spine: 1. No evidence of acute fracture or traumatic malalignment. 2. Moderate multilevel degenerative change. Electronically Signed   By: Feliberto Harts MD   On: 02/11/2021 12:26   CT Cervical Spine Wo Contrast  Result Date:  02/11/2021 CLINICAL DATA:  Trauma. EXAM: CT HEAD WITHOUT CONTRAST CT MAXILLOFACIAL WITHOUT CONTRAST CT CERVICAL SPINE WITHOUT CONTRAST TECHNIQUE: Multidetector CT imaging of the head, cervical spine, and maxillofacial structures were performed using the standard protocol without intravenous contrast. Multiplanar CT image reconstructions of the cervical spine and maxillofacial structures were also generated. COMPARISON:  CT head November 25, 2018. FINDINGS: CT HEAD FINDINGS Brain: No evidence of acute large vascular territory infarction, hemorrhage, hydrocephalus, extra-axial collection or mass lesion/mass effect. Moderate to severe patchy white matter hypoattenuation, likely related to chronic microvascular ischemic disease. Moderate to severe atrophy with ex vacuo ventricular dilation. Similar dystrophic calcification of bilateral frontal white matter. Vascular: No hyperdense vessel identified. Calcific atherosclerosis. Skull: No acute fracture. Other: Small bilateral mastoid effusions. CT MAXILLOFACIAL FINDINGS Osseous: No fracture or mandibular dislocation. No destructive process. Bilateral TMJ degenerative change. Orbits: Negative. No traumatic or inflammatory finding. Sinuses: Sinuses are largely clear. There in area of nonspecific ossification within the posterior/inferior right maxillary sinus. Soft tissues: Negative. CT CERVICAL SPINE FINDINGS Alignment: Slightly exaggerated cervical lordosis without substantial sagittal subluxation. Broad levocurvature. Skull base and vertebrae: No evidence of acute fracture. Vertebral body heights are maintained. Soft tissues and spinal canal: No prevertebral fluid or swelling. No visible canal hematoma. Disc levels: Moderate multilevel degenerative disc disease with disc height loss, endplate sclerosis and spurring. Multilevel facet and uncovertebral hypertrophy with varying degrees of neural foraminal stenosis. Upper chest: Visualized lung apices are clear. IMPRESSION: CT  head: 1. No evidence of acute intracranial abnormality. 2. Moderate to severe chronic microvascular disease and atrophy. CT maxillofacial: 1. No evidence of acute fracture. 2. Bilateral TMJ degenerative change. CT cervical spine: 1. No evidence of acute fracture or traumatic malalignment. 2. Moderate multilevel degenerative change. Electronically Signed   By: Feliberto Harts MD   On: 02/11/2021 12:26   DG CHEST PORT 1 VIEW  Result Date: 02/11/2021 CLINICAL DATA:  Seizure. EXAM: PORTABLE CHEST 1 VIEW COMPARISON:  None. FINDINGS: The heart size and mediastinal contours are within normal limits. Linear left midlung opacity. No consolidation. No visible pleural effusions or pneumothorax. No acute osseous abnormality. Polyarticular degenerative change. IMPRESSION: Linear left midlung opacity, likely scar/atelectasis. Electronically Signed   By: Feliberto Harts MD   On: 02/11/2021 15:10   CT Maxillofacial Wo Contrast  Result Date: 02/11/2021 CLINICAL DATA:  Trauma. EXAM: CT HEAD WITHOUT CONTRAST CT MAXILLOFACIAL WITHOUT CONTRAST CT CERVICAL SPINE WITHOUT CONTRAST TECHNIQUE: Multidetector CT imaging of the head, cervical spine, and maxillofacial structures were performed using the  standard protocol without intravenous contrast. Multiplanar CT image reconstructions of the cervical spine and maxillofacial structures were also generated. COMPARISON:  CT head November 25, 2018. FINDINGS: CT HEAD FINDINGS Brain: No evidence of acute large vascular territory infarction, hemorrhage, hydrocephalus, extra-axial collection or mass lesion/mass effect. Moderate to severe patchy white matter hypoattenuation, likely related to chronic microvascular ischemic disease. Moderate to severe atrophy with ex vacuo ventricular dilation. Similar dystrophic calcification of bilateral frontal white matter. Vascular: No hyperdense vessel identified. Calcific atherosclerosis. Skull: No acute fracture. Other: Small bilateral mastoid  effusions. CT MAXILLOFACIAL FINDINGS Osseous: No fracture or mandibular dislocation. No destructive process. Bilateral TMJ degenerative change. Orbits: Negative. No traumatic or inflammatory finding. Sinuses: Sinuses are largely clear. There in area of nonspecific ossification within the posterior/inferior right maxillary sinus. Soft tissues: Negative. CT CERVICAL SPINE FINDINGS Alignment: Slightly exaggerated cervical lordosis without substantial sagittal subluxation. Broad levocurvature. Skull base and vertebrae: No evidence of acute fracture. Vertebral body heights are maintained. Soft tissues and spinal canal: No prevertebral fluid or swelling. No visible canal hematoma. Disc levels: Moderate multilevel degenerative disc disease with disc height loss, endplate sclerosis and spurring. Multilevel facet and uncovertebral hypertrophy with varying degrees of neural foraminal stenosis. Upper chest: Visualized lung apices are clear. IMPRESSION: CT head: 1. No evidence of acute intracranial abnormality. 2. Moderate to severe chronic microvascular disease and atrophy. CT maxillofacial: 1. No evidence of acute fracture. 2. Bilateral TMJ degenerative change. CT cervical spine: 1. No evidence of acute fracture or traumatic malalignment. 2. Moderate multilevel degenerative change. Electronically Signed   By: Feliberto Harts MD   On: 02/11/2021 12:26    Procedures Procedures   Medications Ordered in ED Medications  levETIRAcetam (KEPPRA) tablet 500 mg (has no administration in time range)  levETIRAcetam (KEPPRA) IVPB 1000 mg/100 mL premix (0 mg Intravenous Stopped 02/11/21 1322)  sodium chloride 0.9 % bolus 500 mL (0 mLs Intravenous Stopped 02/11/21 1411)  LORazepam (ATIVAN) injection 0.25 mg (0.25 mg Intravenous Given 02/11/21 1630)    ED Course  I have reviewed the triage vital signs and the nursing notes.  Pertinent labs & imaging results that were available during my care of the patient were reviewed by me  and considered in my medical decision making (see chart for details).    MDM Rules/Calculators/A&P                          80 y/o F on hospice care presents for eval of seizure that occurred today. Also had seizure last week. She is demented and nonverbal at baseline.   Reviewed/interpreted labs CBC wnl CMP unremarkable UA obtained via I/o cath and there is some hematuria likely secondary to this but there is not obvious evidence of infection at this time. A urine culture was sent  EKG NSE, PVC, abberant conduciotn of SV conplex, anterior infarct, old, prolonged QT   CT head 1. No evidence of acute intracranial abnormality. 2. Moderate to severe chronic microvascular disease and atrophy. CT cervical spine 1. No evidence of acute fracture or traumatic malalignment. 2. Moderate multilevel degenerative change.  CT maxillofacial  1. No evidence of acute fracture. 2. Bilateral TMJ degenerative change.  CONSULT with Dr. Gearldine Shown with neurology who completed a telehealth assessment and recommends initiation of 500 mg keppra BID. Recommends changing klonopin 0.5mg  tid to disintegrating tablets and routine EEG with neurology. Recommends we r/o infectious cause of sxs. If medical w/u does not suggest patient requires admission then  she feels that pt is appropriate for discharge with further outpatient neurology workup  Pt had no findings to suggest infectious etiology of sxs. Her jerking actually improved greatly after administration of keppra and a small dose of ativan in the ED. The facility and the patients husband was updated on her care plan. And she appears to be stable for discharge at this time.     Final Clinical Impression(s) / ED Diagnoses Final diagnoses:  Seizure-like activity [facility])    Rx / DC Orders ED Discharge Orders          Ordered    levETIRAcetam (KEPPRA) 500 MG tablet  2 times daily        02/11/21 1826    clonazePAM (KLONOPIN) 0.5 MG disintegrating tablet  3 times daily         02/11/21 1826             Karrie Meres, PA-C 02/11/21 1829    Karrie Meres, PA-C 02/11/21 1830    [name], MD 02/14/21 [phone]

## 2021-02-11 NOTE — Consult Note (Signed)
[facility] Telemedicine Consult  Requesting Provider: [name] Consult Participants: Bedside nurse Oletta Darter, patient, myself, PA Cortni Couture  Location of the provider: [facility] Location of the patient: [facility]  This consult was provided via telemedicine with 2-way video and audio communication. The patient/family was informed that care would be provided in this way and agreed to receive care in this manner.  Husband was reached via telephone (he was in his own home and not in the hospital) and agreed that care could be delivered in this manner.  Chief Complaint: Seizures and adventitious movements  HPI: Mary Lynch is a 80 y.o. with a past medical history significant for advanced dementia (thought to be Alzheimer's).  Since then her symptoms have progressed to the point that she is living in a care facility on hospice, essentially nonverbal at baseline and does not follow any commands.  She has been having increased movements in the past couple of days concerning for tics to her outside facility team, for which amantadine was increased from 100 mg to 150 mg with first dose given today.  She has also been recently treated with Keflex presumably for UTI since 6/10 (500 mg 4 times daily).  She does have behavioral disturbances with her dementia for which she is additionally on clonazepam 0.5 mg 3 times daily (and has missed several doses recently), a switch well as donepezil 10 mg nightly, Remeron 15 mg nightly, sertraline 50 mg.  Per ED provider her medication list additionally includes loratadine 10 mg, vitamin B6 100 mg twice daily, vitamin D3 400 units daily, vitamin D2 5000 units weekly.  Has been confirms that that the patient had an episode 1 to 2 weeks ago where she stiffened up and fell out of her wheelchair hitting her face on the bed resulting in a black eye and swollen lip.  She was reportedly taken to [facility] at the time  although I am unable to see any records (perhaps it was [facility]).  She was found to have a UTI for which she was started on treatment.  He last visited her on Tuesday with his son at which time she seemed at her baseline (nonverbal but laughed at times at things they said and appeared generally comfortable).  He reports that today again the facility called for letting him know she had had another seizure, biting her lower lip during this episode.  He does note that she has had some agitation of unclear etiology recently which they attributed potentially to discomfort from the UTI.  He is not aware of all the details of her medications himself although he does note he was concerned that when he had visited on a Saturday recently nobody had come in to give any medications while he was there, making him concerned that she was missing some of her medications.  Nurse at bedside reports that the movements seen initially seemed like sudden jerks, nonrhythmic, as if someone was suddenly was startling the patient.  Since administration of Keppra 1000 mg the movements have dramatically improved and are essentially resolved.  She was observed to have a generalized tonic-clonic seizure sometime last week and had another episode of generalized tonic-clonic seizure today per history provided by ED  Patient did receive Keppra 1000 mg IV 12:45 PM  Last neurology records accessible to me are from December 2017 (Dr. Lucia Gaskins) at that time MMSE was 11/30   Past Medical History:  Diagnosis Date   Dementia (HCC)  Time of teleneurologist evaluation: 2:20 PM  Exam: Current vital signs: BP 119/83   Pulse 77   Temp 98.4 F (36.9 C) (Oral)   Resp 20   Ht 5\' 4"  (1.626 m)   Wt 54.4 kg   SpO2 97%   BMI 20.60 kg/m  Vital signs in last 24 hours: Temp:  [98.4 F (36.9 C)] 98.4 F (36.9 C) (06/17 1048) Pulse Rate:  [77] 77 (06/17 1048) Resp:  [19-20] 20 (06/17 1357) BP: (119-125)/(83-93) 119/83 (06/17  1357) SpO2:  [97 %] 97 % (06/17 1048) Weight:  [54.4 kg] 54.4 kg (06/17 1042)    General: Comfortable, interactive with nurse at bedside, does not really look at the monitor, no acute distress.  Healing head laceration and lip bite is apparent Pulmonary: breathing comfortably Cardiac: Per coloration appears to be perfusing extremities well  NIH Stroke scale Mental status: Awake, interactive nods yes when asked by nurse if she will drink some water but does not suck on the straw placed in her mouth.  Nonverbal.  Does not follow any commands. Cranial nerves: Tracks nurse in all directions.  Face grossly symmetric Motor: Purposeful movements of bilateral upper extremities adjusting her blankets and wiping her face.  Wiggles bilateral toes slightly and nursing reports she was moving them equally previously to readjust herself in the bed, curling up etc. Sensory: Appears equally reactive to light touch in all 4 extremities   Imaging Reviewed: Head CT personally reviewed, agree with radiology that there is no evidence of acute intracranial process, though she has significant atrophy and chronic microvascular changes as well as some dystrophic calcification in the brain parenchyma  Formal radiology reads: CT head: 1. No evidence of acute intracranial abnormality. 2. Moderate to severe chronic microvascular disease and atrophy.   CT maxillofacial: 1. No evidence of acute fracture. 2. Bilateral TMJ degenerative change.   CT cervical spine: 1. No evidence of acute fracture or traumatic malalignment. 2. Moderate multilevel degenerative change.    Labs reviewed in epic and pertinent values follow: CMP notable for mildly elevated glucose at 107 otherwise within normal limits including creatinine 0.83 (03/2017 creatinine 1.1) CBC within normal limits Estimated Creatinine Clearance: 46.4 mL/min (by C-G formula based on SCr of 0.83 mg/dL).   Assessment: This is an 80 year old woman with a past  medical history significant for dementia presenting with a second spell concerning for seizure.  Suspect etiology is secondary to dementia, possibly reduced seizure threshold in the setting of missed clonazepam doses.  Certainly with her difficulty following commands and reliably drinking/swallowing medication administration is challenging.  Recommend transitioning to an oral disintegrating tablet for clonazepam to assist in administration.  Additionally given she has responded well to Keppra without clear immediate behavioral disturbance, and she is already on vitamin B6 which frequently mitigates behavioral disturbance caused by Keppra, would favor continuing Keppra on an outpatient basis.  Should she begin to have behavioral disturbance on the Keppra, outpatient provider should consider transition to Depakote instead.  Recommendations:  -Complete infectious work-up with chest x-ray, UA is additionally pending, treatment as needed per ED  -Ordered clonazepam disintegrating tablets 0.5 mg 3 times daily to replace her regular clonazepam 0.5 mg 3 times daily.  The disintegrating tablets can be placed just under the tongue for absorption even if the patient is not participating actively in swallowing -Continue Keppra 500 mg twice daily given that the patient tolerated 1 g loading dose while in the ED  -If patient continues to have significant behavioral  disturbance at her outpatient facility, recommend outpatient providers consider transition to Depakote instead of Keppra -Routine EEG if able in the ED, otherwise can be completed on an outpatient basis given patient has returned to baseline -MRI brain deferred given unlikely to be of diagnostic quality given the patient's underlying dementia, and given head CT obtained was reassuring against acute process.  Should be considered if patient continues to have recurrent events or new neurological issues despite initiation of Keppra and more regular administration  of clonazepam  This patient is receiving care for possible acute neurological changes. There was 60 minutes of care by this provider at the time of service, including time for direct evaluation via telemedicine, review of medical records, imaging studies and discussion of findings with providers, the patient and/or family.  Brooke Dare MD-PhD [facility] [phone] If 8pm-8am, please page neurology on call as listed in AMION.

## 2021-02-11 NOTE — ED Notes (Signed)
Unable to sign MSE, pt with AMS

## 2021-02-11 NOTE — ED Notes (Signed)
PA to contact pts son

## 2021-02-13 LAB — URINE CULTURE: Culture: NO GROWTH

## 2021-03-20 ENCOUNTER — Inpatient Hospital Stay (HOSPITAL_COMMUNITY): Payer: Medicare Other

## 2021-03-20 ENCOUNTER — Emergency Department (HOSPITAL_COMMUNITY): Payer: Medicare Other

## 2021-03-20 ENCOUNTER — Inpatient Hospital Stay (HOSPITAL_COMMUNITY)
Admission: EM | Admit: 2021-03-20 | Discharge: 2021-03-28 | DRG: 871 | Disposition: E | Payer: Medicare Other | Source: Skilled Nursing Facility | Attending: Pulmonary Disease | Admitting: Pulmonary Disease

## 2021-03-20 ENCOUNTER — Other Ambulatory Visit: Payer: Self-pay

## 2021-03-20 ENCOUNTER — Encounter (HOSPITAL_COMMUNITY): Payer: Self-pay

## 2021-03-20 DIAGNOSIS — R9389 Abnormal findings on diagnostic imaging of other specified body structures: Secondary | ICD-10-CM

## 2021-03-20 DIAGNOSIS — E870 Hyperosmolality and hypernatremia: Secondary | ICD-10-CM

## 2021-03-20 DIAGNOSIS — R54 Age-related physical debility: Secondary | ICD-10-CM | POA: Diagnosis present

## 2021-03-20 DIAGNOSIS — I469 Cardiac arrest, cause unspecified: Secondary | ICD-10-CM | POA: Diagnosis not present

## 2021-03-20 DIAGNOSIS — E872 Acidosis, unspecified: Secondary | ICD-10-CM

## 2021-03-20 DIAGNOSIS — R6521 Severe sepsis with septic shock: Secondary | ICD-10-CM | POA: Diagnosis present

## 2021-03-20 DIAGNOSIS — A4151 Sepsis due to Escherichia coli [E. coli]: Principal | ICD-10-CM | POA: Diagnosis present

## 2021-03-20 DIAGNOSIS — R569 Unspecified convulsions: Secondary | ICD-10-CM | POA: Diagnosis present

## 2021-03-20 DIAGNOSIS — Z820 Family history of epilepsy and other diseases of the nervous system: Secondary | ICD-10-CM

## 2021-03-20 DIAGNOSIS — N390 Urinary tract infection, site not specified: Secondary | ICD-10-CM | POA: Diagnosis present

## 2021-03-20 DIAGNOSIS — K7200 Acute and subacute hepatic failure without coma: Secondary | ICD-10-CM | POA: Diagnosis present

## 2021-03-20 DIAGNOSIS — G9341 Metabolic encephalopathy: Secondary | ICD-10-CM | POA: Diagnosis present

## 2021-03-20 DIAGNOSIS — T148XXA Other injury of unspecified body region, initial encounter: Secondary | ICD-10-CM | POA: Diagnosis not present

## 2021-03-20 DIAGNOSIS — J9601 Acute respiratory failure with hypoxia: Secondary | ICD-10-CM | POA: Diagnosis present

## 2021-03-20 DIAGNOSIS — F0281 Dementia in other diseases classified elsewhere with behavioral disturbance: Secondary | ICD-10-CM | POA: Diagnosis not present

## 2021-03-20 DIAGNOSIS — Z79899 Other long term (current) drug therapy: Secondary | ICD-10-CM

## 2021-03-20 DIAGNOSIS — Z880 Allergy status to penicillin: Secondary | ICD-10-CM

## 2021-03-20 DIAGNOSIS — A419 Sepsis, unspecified organism: Secondary | ICD-10-CM | POA: Diagnosis present

## 2021-03-20 DIAGNOSIS — B9620 Unspecified Escherichia coli [E. coli] as the cause of diseases classified elsewhere: Secondary | ICD-10-CM | POA: Diagnosis present

## 2021-03-20 DIAGNOSIS — Z20822 Contact with and (suspected) exposure to covid-19: Secondary | ICD-10-CM | POA: Diagnosis present

## 2021-03-20 DIAGNOSIS — F0280 Dementia in other diseases classified elsewhere without behavioral disturbance: Secondary | ICD-10-CM | POA: Diagnosis present

## 2021-03-20 DIAGNOSIS — I468 Cardiac arrest due to other underlying condition: Secondary | ICD-10-CM | POA: Diagnosis present

## 2021-03-20 DIAGNOSIS — N179 Acute kidney failure, unspecified: Secondary | ICD-10-CM | POA: Diagnosis not present

## 2021-03-20 DIAGNOSIS — Z978 Presence of other specified devices: Secondary | ICD-10-CM | POA: Diagnosis present

## 2021-03-20 DIAGNOSIS — D696 Thrombocytopenia, unspecified: Secondary | ICD-10-CM | POA: Diagnosis present

## 2021-03-20 DIAGNOSIS — Z0189 Encounter for other specified special examinations: Secondary | ICD-10-CM

## 2021-03-20 DIAGNOSIS — Z66 Do not resuscitate: Secondary | ICD-10-CM | POA: Diagnosis not present

## 2021-03-20 DIAGNOSIS — Z515 Encounter for palliative care: Secondary | ICD-10-CM

## 2021-03-20 DIAGNOSIS — Z681 Body mass index (BMI) 19 or less, adult: Secondary | ICD-10-CM | POA: Diagnosis not present

## 2021-03-20 DIAGNOSIS — N170 Acute kidney failure with tubular necrosis: Secondary | ICD-10-CM | POA: Diagnosis present

## 2021-03-20 DIAGNOSIS — L89156 Pressure-induced deep tissue damage of sacral region: Secondary | ICD-10-CM | POA: Diagnosis present

## 2021-03-20 DIAGNOSIS — Z87898 Personal history of other specified conditions: Secondary | ICD-10-CM

## 2021-03-20 DIAGNOSIS — G931 Anoxic brain damage, not elsewhere classified: Secondary | ICD-10-CM | POA: Diagnosis present

## 2021-03-20 DIAGNOSIS — E43 Unspecified severe protein-calorie malnutrition: Secondary | ICD-10-CM | POA: Diagnosis present

## 2021-03-20 DIAGNOSIS — G301 Alzheimer's disease with late onset: Secondary | ICD-10-CM | POA: Diagnosis present

## 2021-03-20 DIAGNOSIS — L8990 Pressure ulcer of unspecified site, unspecified stage: Secondary | ICD-10-CM | POA: Insufficient documentation

## 2021-03-20 HISTORY — DX: Unspecified convulsions: R56.9

## 2021-03-20 LAB — CBC WITH DIFFERENTIAL/PLATELET
Abs Immature Granulocytes: 0.16 10*3/uL — ABNORMAL HIGH (ref 0.00–0.07)
Basophils Absolute: 0 10*3/uL (ref 0.0–0.1)
Basophils Relative: 0 %
Eosinophils Absolute: 0 10*3/uL (ref 0.0–0.5)
Eosinophils Relative: 0 %
HCT: 40.8 % (ref 36.0–46.0)
Hemoglobin: 12.2 g/dL (ref 12.0–15.0)
Immature Granulocytes: 2 %
Lymphocytes Relative: 9 %
Lymphs Abs: 0.8 10*3/uL (ref 0.7–4.0)
MCH: 32.4 pg (ref 26.0–34.0)
MCHC: 29.9 g/dL — ABNORMAL LOW (ref 30.0–36.0)
MCV: 108.2 fL — ABNORMAL HIGH (ref 80.0–100.0)
Monocytes Absolute: 0.3 10*3/uL (ref 0.1–1.0)
Monocytes Relative: 4 %
Neutro Abs: 7.2 10*3/uL (ref 1.7–7.7)
Neutrophils Relative %: 85 %
Platelets: 107 10*3/uL — ABNORMAL LOW (ref 150–400)
RBC: 3.77 MIL/uL — ABNORMAL LOW (ref 3.87–5.11)
RDW: 14.5 % (ref 11.5–15.5)
WBC: 8.5 10*3/uL (ref 4.0–10.5)
nRBC: 0.2 % (ref 0.0–0.2)

## 2021-03-20 LAB — COMPREHENSIVE METABOLIC PANEL
ALT: 905 U/L — ABNORMAL HIGH (ref 0–44)
AST: 932 U/L — ABNORMAL HIGH (ref 15–41)
Albumin: 2.1 g/dL — ABNORMAL LOW (ref 3.5–5.0)
Alkaline Phosphatase: 67 U/L (ref 38–126)
Anion gap: 18 — ABNORMAL HIGH (ref 5–15)
BUN: 104 mg/dL — ABNORMAL HIGH (ref 8–23)
CO2: 10 mmol/L — ABNORMAL LOW (ref 22–32)
Calcium: 6.9 mg/dL — ABNORMAL LOW (ref 8.9–10.3)
Chloride: 129 mmol/L — ABNORMAL HIGH (ref 98–111)
Creatinine, Ser: 3.75 mg/dL — ABNORMAL HIGH (ref 0.44–1.00)
GFR, Estimated: 12 mL/min — ABNORMAL LOW (ref >60)
Glucose, Bld: 45 mg/dL — ABNORMAL LOW (ref 70–99)
Potassium: 4.8 mmol/L (ref 3.5–5.1)
Sodium: 157 mmol/L — ABNORMAL HIGH (ref 135–145)
Total Bilirubin: 1.3 mg/dL — ABNORMAL HIGH (ref 0.3–1.2)
Total Protein: 3.9 g/dL — ABNORMAL LOW (ref 6.5–8.1)

## 2021-03-20 LAB — BLOOD GAS, VENOUS
Acid-base deficit: 13.4 mmol/L — ABNORMAL HIGH (ref 0.0–2.0)
Bicarbonate: 13 mmol/L — ABNORMAL LOW (ref 20.0–28.0)
FIO2: 100
O2 Saturation: 58.3 %
Patient temperature: 34
pCO2, Ven: 39.9 mmHg — ABNORMAL LOW (ref 44.0–60.0)
pH, Ven: 7.158 — CL (ref 7.250–7.430)
pO2, Ven: 42.1 mmHg (ref 32.0–45.0)

## 2021-03-20 LAB — CBG MONITORING, ED: Glucose-Capillary: 107 mg/dL — ABNORMAL HIGH (ref 70–99)

## 2021-03-20 LAB — URINALYSIS, ROUTINE W REFLEX MICROSCOPIC
Bilirubin Urine: NEGATIVE
Glucose, UA: NEGATIVE mg/dL
Ketones, ur: NEGATIVE mg/dL
Nitrite: NEGATIVE
Protein, ur: 100 mg/dL — AB
RBC / HPF: >50 RBC/hpf — ABNORMAL HIGH (ref 0–5)
Specific Gravity, Urine: 1.014 (ref 1.005–1.030)
WBC, UA: >50 WBC/hpf — ABNORMAL HIGH (ref 0–5)
pH: 5 (ref 5.0–8.0)

## 2021-03-20 LAB — APTT: aPTT: 45 seconds — ABNORMAL HIGH (ref 24–36)

## 2021-03-20 LAB — RESP PANEL BY RT-PCR (FLU A&B, COVID) ARPGX2
Influenza A by PCR: NEGATIVE
Influenza B by PCR: NEGATIVE
SARS Coronavirus 2 by RT PCR: NEGATIVE

## 2021-03-20 LAB — MRSA NEXT GEN BY PCR, NASAL: MRSA by PCR Next Gen: DETECTED — AB

## 2021-03-20 LAB — GLUCOSE, CAPILLARY
Glucose-Capillary: 139 mg/dL — ABNORMAL HIGH (ref 70–99)
Glucose-Capillary: 139 mg/dL — ABNORMAL HIGH (ref 70–99)

## 2021-03-20 LAB — LACTIC ACID, PLASMA
Lactic Acid, Venous: >11 mmol/L (ref 0.5–1.9)
Lactic Acid, Venous: 10.2 mmol/L (ref 0.5–1.9)

## 2021-03-20 LAB — PROTIME-INR
INR: 2.4 — ABNORMAL HIGH (ref 0.8–1.2)
Prothrombin Time: 25.9 seconds — ABNORMAL HIGH (ref 11.4–15.2)

## 2021-03-20 MED ORDER — SUCCINYLCHOLINE CHLORIDE 20 MG/ML IJ SOLN: 100.0000 mg | Freq: Once | INTRAMUSCULAR | Status: DC

## 2021-03-20 MED ORDER — ETOMIDATE 2 MG/ML IV SOLN: 20.0000 mg | Freq: Once | INTRAVENOUS | Status: DC

## 2021-03-20 MED ORDER — LEVETIRACETAM IN NACL 500 MG/100ML IV SOLN
500.0000 mg | Freq: Two times a day (BID) | INTRAVENOUS | Status: DC
  Administered 2021-03-21: 500 mg via INTRAVENOUS
  Filled 2021-03-20: qty 100

## 2021-03-20 MED ORDER — PANTOPRAZOLE SODIUM 40 MG IV SOLR
40.0000 mg | Freq: Every day | INTRAVENOUS | Status: DC
  Administered 2021-03-21: 40 mg via INTRAVENOUS
  Filled 2021-03-20: qty 40

## 2021-03-20 MED ORDER — LACTATED RINGERS IV BOLUS (SEPSIS)
1000.0000 mL | Freq: Once | INTRAVENOUS | Status: AC
  Administered 2021-03-20: 1000 mL via INTRAVENOUS

## 2021-03-20 MED ORDER — DEXTROSE 5 % IV SOLN
0.5000 g | Freq: Three times a day (TID) | INTRAVENOUS | Status: DC
  Administered 2021-03-21 (×2): 0.5 g via INTRAVENOUS
  Filled 2021-03-20 (×4): qty 0.5

## 2021-03-20 MED ORDER — DEXMEDETOMIDINE HCL IN NACL 400 MCG/100ML IV SOLN: 0.0000 ug/kg/h | INTRAVENOUS | Status: DC

## 2021-03-20 MED ORDER — VANCOMYCIN HCL 500 MG/100ML IV SOLN
500.0000 mg | INTRAVENOUS | Status: DC
Start: 2021-03-22 — End: 2021-03-21

## 2021-03-20 MED ORDER — PROPOFOL 1000 MG/100ML IV EMUL
5.0000 ug/kg/min | INTRAVENOUS | Status: DC
  Administered 2021-03-20 (×2): 5 ug/kg/min via INTRAVENOUS

## 2021-03-20 MED ORDER — SODIUM CHLORIDE 0.9 % IV SOLN: 250.0000 mL | INTRAVENOUS | Status: DC

## 2021-03-20 MED ORDER — NOREPINEPHRINE 4 MG/250ML-% IV SOLN
0.0000 ug/min | INTRAVENOUS | Status: DC
  Administered 2021-03-20: 2 ug/min via INTRAVENOUS
  Filled 2021-03-20: qty 250

## 2021-03-20 MED ORDER — SODIUM CHLORIDE 0.9 % IV BOLUS
2000.0000 mL | Freq: Once | INTRAVENOUS | Status: AC
  Administered 2021-03-20: 2000 mL via INTRAVENOUS

## 2021-03-20 MED ORDER — LACTATED RINGERS IV SOLN: INTRAVENOUS | Status: DC

## 2021-03-20 MED ORDER — PROPOFOL 1000 MG/100ML IV EMUL
5.0000 ug/kg/min | INTRAVENOUS | Status: DC
  Administered 2021-03-20: 5 ug/kg/min via INTRAVENOUS

## 2021-03-20 MED ORDER — CHLORHEXIDINE GLUCONATE CLOTH 2 % EX PADS
6.0000 | MEDICATED_PAD | Freq: Every day | CUTANEOUS | Status: DC
  Administered 2021-03-20: 6 via TOPICAL

## 2021-03-20 MED ORDER — HEPARIN SODIUM (PORCINE) 5000 UNIT/ML IJ SOLN
5000.0000 [IU] | Freq: Three times a day (TID) | INTRAMUSCULAR | Status: DC
  Administered 2021-03-21 (×2): 5000 [IU] via SUBCUTANEOUS
  Filled 2021-03-20 (×2): qty 1

## 2021-03-20 MED ORDER — NOREPINEPHRINE 4 MG/250ML-% IV SOLN
0.0000 ug/min | INTRAVENOUS | Status: DC
  Administered 2021-03-20 – 2021-03-21 (×2): 10 ug/min via INTRAVENOUS
  Filled 2021-03-20 (×2): qty 250

## 2021-03-20 MED ORDER — SODIUM CHLORIDE 0.9 % IV SOLN
2.0000 g | Freq: Once | INTRAVENOUS | Status: AC
  Administered 2021-03-20: 2 g via INTRAVENOUS
  Filled 2021-03-20: qty 2

## 2021-03-20 MED ORDER — ORAL CARE MOUTH RINSE
15.0000 mL | OROMUCOSAL | Status: DC
  Administered 2021-03-21 – 2021-03-22 (×9): 15 mL via OROMUCOSAL

## 2021-03-20 MED ORDER — DEXTROSE 50 % IV SOLN
INTRAVENOUS | Status: AC
  Administered 2021-03-20: 50 mL via INTRAVENOUS
  Filled 2021-03-20: qty 50

## 2021-03-20 MED ORDER — LACTATED RINGERS IV BOLUS (SEPSIS)
250.0000 mL | Freq: Once | INTRAVENOUS | Status: AC
  Administered 2021-03-20: 250 mL via INTRAVENOUS

## 2021-03-20 MED ORDER — DOCUSATE SODIUM 50 MG/5ML PO LIQD: 100.0000 mg | Freq: Two times a day (BID) | ORAL | Status: DC | PRN

## 2021-03-20 MED ORDER — NOREPINEPHRINE 4 MG/250ML-% IV SOLN: 2.0000 ug/min | INTRAVENOUS | Status: DC

## 2021-03-20 MED ORDER — PROPOFOL 1000 MG/100ML IV EMUL
INTRAVENOUS | Status: AC
  Filled 2021-03-20: qty 100

## 2021-03-20 MED ORDER — POLYETHYLENE GLYCOL 3350 17 G PO PACK
17.0000 g | PACK | Freq: Every day | ORAL | Status: DC | PRN
Start: 2021-03-20 — End: 2021-03-21

## 2021-03-20 MED ORDER — NALOXONE HCL 2 MG/2ML IJ SOSY: 2.0000 mg | PREFILLED_SYRINGE | Freq: Once | INTRAMUSCULAR | Status: AC

## 2021-03-20 MED ORDER — LACTATED RINGERS IV BOLUS (SEPSIS)
500.0000 mL | Freq: Once | INTRAVENOUS | Status: AC
  Administered 2021-03-20: 500 mL via INTRAVENOUS

## 2021-03-20 MED ORDER — NALOXONE HCL 2 MG/2ML IJ SOSY
PREFILLED_SYRINGE | INTRAMUSCULAR | Status: AC
  Administered 2021-03-20: 2 mg via INTRAVENOUS
  Filled 2021-03-20: qty 2

## 2021-03-20 MED ORDER — DEXTROSE 50 % IV SOLN: 50.0000 mL | Freq: Once | INTRAVENOUS | Status: AC

## 2021-03-20 MED ORDER — CHLORHEXIDINE GLUCONATE 0.12% ORAL RINSE (MEDLINE KIT)
15.0000 mL | Freq: Two times a day (BID) | OROMUCOSAL | Status: DC
  Administered 2021-03-20 – 2021-03-21 (×3): 15 mL via OROMUCOSAL

## 2021-03-20 MED ORDER — LEVETIRACETAM IN NACL 500 MG/100ML IV SOLN
500.0000 mg | Freq: Once | INTRAVENOUS | Status: AC
  Administered 2021-03-20: 500 mg via INTRAVENOUS
  Filled 2021-03-20: qty 100

## 2021-03-20 MED ORDER — VANCOMYCIN HCL IN DEXTROSE 1-5 GM/200ML-% IV SOLN
1000.0000 mg | Freq: Once | INTRAVENOUS | Status: AC
  Administered 2021-03-20: 1000 mg via INTRAVENOUS
  Filled 2021-03-20: qty 200

## 2021-03-20 MED ORDER — METRONIDAZOLE 500 MG/100ML IV SOLN
500.0000 mg | Freq: Once | INTRAVENOUS | Status: AC
  Administered 2021-03-20: 500 mg via INTRAVENOUS
  Filled 2021-03-20: qty 100

## 2021-03-20 NOTE — ED Notes (Signed)
Lab attempting to get bloodwork at this time.

## 2021-03-20 NOTE — ED Triage Notes (Signed)
Pt brought to ED via [facility] emergency traffic unresponsive. Pt is a resident of [facility], found unresponsive by staff, staff initiated 30 compressions, stopped after they noted she was breathing. EMS arrived unable to get BP initially, first BP obtained systolic 50, started IO and given bolus with pressure bag unknown amount per EMS, last systolic 94. CBG 94

## 2021-03-20 NOTE — H&P (Addendum)
NAME:  Mary Lynch, MRN:  [id number], DOB:  06/28/1941, LOS: 0 ADMISSION DATE:  03/07/2021, CONSULTATION DATE:  7/24 REFERRING MD:  Roderic Palau MD, CHIEF COMPLAINT:  Cardiac Arrest   History of Present Illness:  Patient is encephalopathic and/or intubated. Therefore history has been obtained from chart review.   Mary Lynch ,is a 80 y.o. female, who presented to the [facility] ED with a chief complaint of cardiac arrest.  They have a pertinent past medical history of seizures and dementia  Per chart review, Mary Lynch is a resident of an assisted living facility. She was found unresponsive and pulseless. There is reported unknown downtime and unknown time until ACLS initation. 30 compressions with patient found to be breathing.  On arrival to the [facility] ED she was found to be agonally breathing and was intubated. 3.7 L IVF was given. BC and UA were obtained. She was given a dose of vanc, flagyl, and aztreonam. 545m keppra given. Head CT was negative for acute process. Initial lactate greater than 11, creatine 3.75, ast 932, alt 905, INR 1.4, BG 43, ABG 7.15/39.9/42/13 on 100%. 12 lead with scant inferior st depression.  PCCM was asked to admit the patient  Pertinent  Medical History   Seizures and dementia  Significant Hospital Events: Including procedures, antibiotic start and stop dates in addition to other pertinent events   7/24 Cardiac arrest. Presented to [facility] ED. Admit  Interim History / Subjective:  See above  Unable to obtain subjective evaluation due to patient status  On 10 mcg levophed  Objective   Blood pressure 92/76, pulse 79, temperature (!) 95 F (35 C), resp. rate (!) 25, height 5' (1.524 m), weight 54.4 kg, SpO2 98 %.    Vent Mode: PRVC FiO2 (%):  [70 %-100 %] 80 % Set Rate:  [18 bmp] 18 bmp Vt Set:  [360 mL] 360 mL PEEP:  [5 cmH20] 5 cmH20 Plateau Pressure:  [11 cmH20-13 cmH20] 11 cmH20   Intake/Output Summary (Last 24 hours) at 02/27/2021 2156 Last data  filed at 03/26/2021 1953 Gross per 24 hour  Intake 3877.36 ml  Output --  Net 3877.36 ml   Filed Weights   03/19/2021 1517  Weight: 54.4 kg    Examination: General:  frail, in bed no acute distress HEENT: MM pink/moist, anicteric, atraumatic Neuro: GCS 6, no eye opening, withdraws lowers, RASS -4, PERRL 259m jerking in arms seen on exam. CV: S1S2, NSR, no m/r/g appreciated PULM:  clear in the upper lobes and in the lower lobes, Trachea midline, chest expansion symmetric GI: soft, bsx4 hypoactive, non distended  Extremities: warm/dry, no pretibial edema, capillary refill less than 3 seconds  Skin: no rashes or lesions, DTI on sacrum  Resolved Hospital Problem list     Assessment & Plan:  Jehovah's Witness Patient-No blood products Confirmed with husband on 7/24 at 2200. Husband is not coming to hospital tonight -Limit blood draws and proceedures as able -All ordered labs currently are to be done at 07/24 at 0500. Limit sticks and blood draw. Draw in pedi tubes if able.   Cardiac arrest- per documentation at OSH. Found responsive and pulseless. Unknown downtime and unknown time until CPR start. Per nursing note. 30 compressions with patient found to be breathing. Unclear if this was agonal respirations. Found to be agonally breathing per ED provider. EKG with small ST depression in inferior leads. Suspect secondary to acidosis. Septic shock secondary to UTI- hypotensive. UA suggests UTI. S/P 3.8 ml fluid  resusitation. AGMA- suspect secondary to lactic acidosis.  Lactic acidosis- Lactate greater than 11>10 Secondary respiratory acidosis- not appropriately compensated per winters formula.  NAGMA- cause unclear at this time. 2L NS given in ED.  -Goal normothermia.Target temperature between 36 and 37.8 degrees celsius. Notify provider if temperature goals are not met. -Goal MAP 65 or greater. Levophed ordered. Titrate medication to goal -Obtain/Follow up BC and UC -S/P 3.8 ml fluid  resusitation. Holding on further admin at this time -On aztreonam and Vancomycin. Narrow as cultures result -Trend lactate -Obtain troponin. -Obtain ECHO -Follow up CBC, CMP, Coags -Ventilatory support as below -Admit to the ICU with tele and spo2 monitoring  Acute Metabolic Encephalopathy vs ?Anoxic Brain Injury Secondary to cardiac arrest, sepsis, and metabolic derangements. Head CT 7/24 negative for acute process. Agonal on arrival per EDP -Will consult neurology. EEG per neurology -Will consider MRI around 72 hours post arrest depending on clinical course -PAD bundle with precedex. Goal RASS 0. Titrate medication to goal. -Frequent neuro exams -Goal normothermia.Target temperature between 36 and 37.8 degrees celsius. Notify provider if temperature goals are not met.  Acute respiratory failure with hypoxemia PO2 42, CXR no infiltrate, effusion. Secondary to arrest -LTVV strategy with tidal volumes of 4-8 cc/kg ideal body weight -Goal plateau pressures of 30 and driving pressures of 15 -Wean PEEP/FiO2 for SpO2 92% - 98% -Follow intermittent CXR and ABG PRN -VAP bundle -Daily SAT and SBT -PAD bundle with precedex  AKI Creatinine 3.75, 0.83 1 month ago. BUN 104. Suspect secondary to cardiac arrest, hypotension and sepsis. Urine present in bag. -Ensure renal perfusion. Goal MAP 65 or greater. -Avoid neprotoxic drugs as possible. -Strict I&O's -Follow up AM creatinine -Obtain renal ultrasound  Shock Liver AST 932, ALT 905, INR 2.4, bili 1.3, albumin 2.1 secondary to cardiac arrest, sepsis shock. LFT normal 1 month ago. -Goal map greater than 65 -Monitor on CMP -Supportive care  Thrombocytopenia Plt 107, suspect secondary to sepsis -If no signs of active bleeding, will consider PLT transfusion for platelets less than 10K -If signs of active bleeding or surgical procedure indicated, will consider PlT transfusion for platelets less than 50K -Follow up AM PLT levels -Monitor  for signs of active bleeding  Hypernatremia NA 157. ? Volume depletion. Documented 2L NS given in ED. Possibility of contaminate.  -Repeat labs post fluid resuscitation at [facility] -Will calculate free water deficit and begin correction based on repeat labs  DTI- present on admission -wound care per nursing  H/O Seizure -Continue Keppra 576m bid  H/X dementia -Holding home meds at this time of critical illness  Multiple organs with dysfunction. Suspect poor clinical trajectory based on presentation.  Best Practice (right click and "Reselect all SmartList Selections" daily)   Diet/type: NPO DVT prophylaxis: SCD GI prophylaxis: PPI Lines: N/A Foley:  Yes, and it is still needed Code Status:  DNR Last date of multidisciplinary goals of care discussion [Spoke with husband LMadyson Lukachat [phone] 7/24, patient does have living will, instructed to bring. Husband states that "she would not want to be a vegetable"]  Labs   CBC: Recent Labs  Lab 03/24/2021 1608  WBC 8.5  NEUTROABS 7.2  HGB 12.2  HCT 40.8  MCV 108.2*  PLT 107*    Basic Metabolic Panel: Recent Labs  Lab 03/08/2021 1608  NA 157*  K 4.8  CL 129*  CO2 10*  GLUCOSE 45*  BUN 104*  CREATININE 3.75*  CALCIUM 6.9*   GFR: Estimated Creatinine Clearance: 8.6 mL/min (  A) (by C-G formula based on SCr of 3.75 mg/dL (H)). Recent Labs  Lab 03/24/2021 1608 03/12/2021 1742  WBC 8.5  --   LATICACIDVEN >11.0* 10.2*    Liver Function Tests: Recent Labs  Lab 03/21/2021 1608  AST 932*  ALT 905*  ALKPHOS 67  BILITOT 1.3*  PROT 3.9*  ALBUMIN 2.1*   No results for input(s): LIPASE, AMYLASE in the last 168 hours. No results for input(s): AMMONIA in the last 168 hours.  ABG    Component Value Date/Time   HCO3 13.0 (L) 03/10/2021 1742   ACIDBASEDEF 13.4 (H) 03/04/2021 1742   O2SAT 58.3 03/06/2021 1742     Coagulation Profile: Recent Labs  Lab 03/07/2021 1608  INR 2.4*    Cardiac Enzymes: No results for  input(s): CKTOTAL, CKMB, CKMBINDEX, TROPONINI in the last 168 hours.  HbA1C: No results found for: HGBA1C  CBG: Recent Labs  Lab 03/12/2021 1752 03/19/2021 2155  GLUCAP 107* 139*    Review of Systems:   Unable to obtain ROS evaluation due to patient status   Past Medical History:  She,  has a past medical history of Dementia ([place]) and Seizures ([place]).   Surgical History:   Past Surgical History:  Procedure Laterality Date   BACK SURGERY       Social History:   reports that she has never smoked. She has never used smokeless tobacco. She reports that she does not drink alcohol and does not use drugs.   Family History:  Her family history includes Alzheimer's disease in her father.   Allergies Allergies  Allergen Reactions   Penicillin G Rash    .Has patient had a PCN reaction causing immediate rash, facial/tongue/throat swelling, SOB or lightheadedness with hypotension: yes Has patient had a PCN reaction causing severe rash involving mucus membranes or skin necrosis: no Has patient had a PCN reaction that required hospitalization: yes Has patient had a PCN reaction occurring within the last 10 years: no If all of the above answers are "NO", then may proceed with Cephalosporin use     Home Medications  Prior to Admission medications   Medication Sig Start Date End Date Taking? Authorizing Provider  Amantadine HCl 100 MG tablet Take 150 mg by mouth in the morning and at bedtime. 02/10/21  Yes [provider]  Cholecalciferol (VITAMIN D3) 10 MCG (400 UNIT) CAPS Take 1 capsule by mouth daily.   Yes [provider]  clonazePAM (KLONOPIN) 0.5 MG disintegrating tablet Take 1 tablet (0.5 mg total) by mouth 3 (three) times daily for 7 days. 02/11/21 03/19/2021 Yes Couture, Cortni S, PA-C  donepezil (ARICEPT ODT) 10 MG disintegrating tablet Take 10 mg by mouth at bedtime. 01/25/21  Yes [provider]  ergocalciferol (VITAMIN D2) 1.25 MG (50000 UT) capsule Take  50,000 Units by mouth once a week.   Yes [provider]  levETIRAcetam (KEPPRA) 250 MG tablet Take 250 mg by mouth daily at 12 noon. Take 1 tablet at noon and then take 500 mg twice a day   Yes [provider]  levETIRAcetam (KEPPRA) 500 MG tablet Take 1 tablet (500 mg total) by mouth 2 (two) times daily. Patient taking differently: Take 500 mg by mouth daily. Take 250 mg at 12 noon and 500 mg twice a day 02/11/21 03/11/2021 Yes Couture, Cortni S, PA-C  loratadine (CLARITIN) 10 MG tablet Take 10 mg by mouth daily as needed for allergies.   Yes [provider]  mirtazapine (REMERON) 15 MG tablet  Take 15 mg by mouth at bedtime.   Yes [provider]  Multiple Vitamin (MULTIVITAMIN) capsule Take 1 capsule by mouth daily.   Yes [provider]  pyridoxine (B-6) 100 MG tablet Take 100 mg by mouth daily.   Yes [provider]  sertraline (ZOLOFT) 50 MG tablet Take 25 mg by mouth daily.   Yes [provider]  cephALEXin (KEFLEX) 500 MG capsule Take 500 mg by mouth 4 (four) times daily. Patient not taking: Reported on 03/08/2021    [provider]     Critical care time: 71 minutes    Redmond School., MSN, APRN, AGACNP-BC [facility]  03/21/2021 , 9:56 PM  Please see [email or web address] for pager details  If no response, please call [phone] After hours, please call Elink at [phone]  Addendum: spoke with Sal with Neurology. Restarting propofol until EEG hooked up. Given 573m keppra in [facility] ed. Giving another 5059mkeppra now.

## 2021-03-20 NOTE — ED Notes (Signed)
Propofol bolus of 10 mcg given via pump per Dr Estell Harpin for agitation.

## 2021-03-20 NOTE — ED Notes (Signed)
Bair hugger applied to patient.

## 2021-03-20 NOTE — Progress Notes (Signed)
[facility] following for sepsis protocol. 

## 2021-03-20 NOTE — Progress Notes (Signed)
[facility] is following for completion of sepsis protocol, have discussed need for following lactic acid with CCM. At this time lactic will be drawn with AM labs due to patient is a Jehovah Witness, gathering all labs to prevent multiple sticks. On vent, with fluids, low dose pressor and was made DNR. Continue to monitor closely

## 2021-03-20 NOTE — ED Notes (Signed)
ET tube advanced at this time. Tip to lip 25 cm.

## 2021-03-20 NOTE — ED Notes (Signed)
Unable to get lab work with IV sticks.

## 2021-03-20 NOTE — ED Notes (Signed)
etomidate and succinylcholine wasted in stericycle by This Rn and Verlee Rossetti. Meds not given per MD verbal order.

## 2021-03-20 NOTE — ED Notes (Signed)
Date and time results received: 03/14/2021 1811 (use smartphrase ".now" to insert current time)  Test: Lactic Acid Critical Value: 10.2  Name of Provider Notified: Dr. Estell Harpin  Orders Received? Or Actions Taken?: na

## 2021-03-20 NOTE — Consult Note (Addendum)
NEUROLOGY CONSULTATION NOTE   Date of service: March 20, 2021 Patient Name: Mary Lynch MRN:  [id number] DOB:  1941/03/07 Reason for consult: "Anoxic injury, now having abnormal movements" Requesting Provider: Lanier Clam, MD _ _ _   _ __   _ __ _ _  __ __   _ __   __ _  History of Present Illness  Mary Lynch is a 80 y.o. female with PMH significant for advanced dementia and on hospice at an assisted living facilty, hx of tics and seizures noted recently and started on Keppra $RemoveB'500mg'thBFfNoQ$  BID who was found unresponsive at her asisted living facility. Unknown downtime, was also pulseless per notes. Staff initiated 30 compressions, stopped after they noted that she was ?breathing. EMS arrived and unable to get a blood pressure. First BP was systolic of 50. She was intubated at [facility] ED for agonal respirations at presentation and transferred to [facility] ICU.  Initial CTH with no acute abnormalities, labwork with lactate > 11, elevated Creatinine to 3.75, Sodium of 157,transaminitis with INR of 2.4, ABG with pH of 7.15.  She was also given a one time dose of Keppra $RemoveB'500mg'BfWQJeJr$ . She was noted to have movements of all extremitie sfor which we were consulted.  On evaluation, she was noted to have twitching in BL upper extremities and occasionally in her face. Some of these movements appeared to be somewhat arrhythmic. She was also noted to have some facial and eyelid twitches vs fasciculations.     ROS   Unable to obtain PMH, PSH, Fhx, Shx 2/2 intubation and obtunded mentation  Past History   Past Medical History:  Diagnosis Date   Dementia ([place])    Seizures ([place])    Past Surgical History:  Procedure Laterality Date   BACK SURGERY     Family History  Problem Relation Age of Onset   Alzheimer's disease Father    Social History   Socioeconomic History   Marital status: Married    Spouse name: Not on file   Number of children: 2   Years of education: 12   Highest  education level: Not on file  Occupational History   Occupation: retired  Tobacco Use   Smoking status: Never   Smokeless tobacco: Never  Substance and Sexual Activity   Alcohol use: No   Drug use: No   Sexual activity: Not on file  Other Topics Concern   Not on file  Social History Narrative   Lives with husband   Caffeine use: Coffee/tea/soda daily   Social Determinants of Health   Financial Resource Strain: Not on file  Food Insecurity: Not on file  Transportation Needs: Not on file  Physical Activity: Not on file  Stress: Not on file  Social Connections: Not on file   Allergies  Allergen Reactions   Penicillin G Rash    .Has patient had a PCN reaction causing immediate rash, facial/tongue/throat swelling, SOB or lightheadedness with hypotension: yes Has patient had a PCN reaction causing severe rash involving mucus membranes or skin necrosis: no Has patient had a PCN reaction that required hospitalization: yes Has patient had a PCN reaction occurring within the last 10 years: no If all of the above answers are "NO", then may proceed with Cephalosporin use    Medications   Medications Prior to Admission  Medication Sig Dispense Refill Last Dose   Amantadine HCl 100 MG tablet Take 150 mg by mouth in the morning and at bedtime.  03/13/2021   Cholecalciferol (VITAMIN D3) 10 MCG (400 UNIT) CAPS Take 1 capsule by mouth daily.   03/15/2021   clonazePAM (KLONOPIN) 0.5 MG disintegrating tablet Take 1 tablet (0.5 mg total) by mouth 3 (three) times daily for 7 days. 21 tablet 0 03/18/2021 at 0800   donepezil (ARICEPT ODT) 10 MG disintegrating tablet Take 10 mg by mouth at bedtime.   03/19/2021   ergocalciferol (VITAMIN D2) 1.25 MG (50000 UT) capsule Take 50,000 Units by mouth once a week.   03/18/2021   levETIRAcetam (KEPPRA) 250 MG tablet Take 250 mg by mouth daily at 12 noon. Take 1 tablet at noon and then take 500 mg twice a day   02/27/2021   levETIRAcetam (KEPPRA) 500 MG tablet  Take 1 tablet (500 mg total) by mouth 2 (two) times daily. (Patient taking differently: Take 500 mg by mouth daily. Take 250 mg at 12 noon and 500 mg twice a day) 60 tablet 0 03/17/2021   loratadine (CLARITIN) 10 MG tablet Take 10 mg by mouth daily as needed for allergies.      mirtazapine (REMERON) 15 MG tablet Take 15 mg by mouth at bedtime.   03/19/2021   Multiple Vitamin (MULTIVITAMIN) capsule Take 1 capsule by mouth daily.   03/19/2021   pyridoxine (B-6) 100 MG tablet Take 100 mg by mouth daily.   03/26/2021   sertraline (ZOLOFT) 50 MG tablet Take 25 mg by mouth daily.   03/18/2021   cephALEXin (KEFLEX) 500 MG capsule Take 500 mg by mouth 4 (four) times daily. (Patient not taking: Reported on 03/27/2021)   Not Taking     Vitals   Vitals:   03/21/2021 1950 03/12/2021 2000 02/28/2021 2135 02/28/2021 2200  BP:  (!) 145/102 92/76 (!) 116/96  Pulse:  74 79 64  Resp:  (!) 23 (!) 25 20  Temp:  (!) 95.18 F (35.1 C)  (!) 95.54 F (35.3 C)  TempSrc:      SpO2: 100% 100% 98% 98%  Weight:      Height: 5' (1.524 m)        Body mass index is 23.44 kg/m.  Physical Exam   General: Laying comfortably in bed; in no acute distress. HENT: Normal oropharynx and mucosa. Normal external appearance of ears and nose.  Neck: Supple, no pain or tenderness  CV: No JVD. No peripheral edema.  Pulmonary: Symmetric Chest rise. Normal respiratory effort.  Abdomen: Soft to touch, non-tender.  Ext: No cyanosis, edema, or deformity  Skin: No rash. Normal palpation of skin.   Musculoskeletal: Normal digits and nails by inspection. No clubbing.   Neurologic Examination  Mental status/Cognition: L eye partially open and R eye closed. Does not open to voice or loud clap. BL frowning of eyebrows noted to nares stimulation and turns her head to the left. No eye contact. Speech/language: intubated, no attempts to communicate. Brainstem: Pupils: 37mm BL, equal round and reactive to light. Corneals: + BL Cough: absent Gag:  present Did not attempt dolls eyes due to poor history available.  Motor/sensory:  Muscle bulk: poor, tone: cogwheeling noted in BL upper extremities. She withdraws BL upper extremities to noxious stimuli. She withraws BL lower extremities to babinski's reflex.   Reflexes:  Right Left Comments  Pectoralis      Biceps (C5/6) 2 2   Brachioradialis (C5/6) 2 2    Triceps (C6/7) 2 2    Patellar (L3/4) 2 2    Achilles (S1)      Hoffman  Plantar withdraws withdraws   Jaw jerk      Labs   CBC:  Recent Labs  Lab 03/19/2021 1608  WBC 8.5  NEUTROABS 7.2  HGB 12.2  HCT 40.8  MCV 108.2*  PLT 107*    Basic Metabolic Panel:  Lab Results  Component Value Date   NA 157 (H) 03/11/2021   K 4.8 03/18/2021   CO2 10 (L) 03/14/2021   GLUCOSE 45 (L) 03/11/2021   BUN 104 (H) 03/06/2021   CREATININE 3.75 (H) 03/08/2021   CALCIUM 6.9 (L) 03/21/2021   GFRNONAA 12 (L) 03/24/2021   GFRAA 55 (L) 04/16/2017   Lipid Panel: No results found for: LDLCALC HgbA1c: No results found for: HGBA1C Urine Drug Screen: No results found for: LABOPIA, COCAINSCRNUR, LABBENZ, AMPHETMU, THCU, LABBARB  Alcohol Level No results found for: [place]  CT Head without contrast: Personally reviewed and notable for atrophy, otherwise no large strokes or ICH.   MRI Brain: pending  Ceribell EEG No seizures so far.  Impression   Mary Lynch is a 80 y.o. female with PMH significant for advanced dementia and on hospice at an assisted living facilty, hx of tics and seizures noted recently and started on Keppra $RemoveB'500mg'rvXLFfLm$  BID who was found unresponsive at her asisted living facility. Lot of uncertainty and the specifis of the event are not clear in the chart. unknown downtime, was also ?pulseless per notes. Staff initiated 30 compressions, stopped after they noted that she was ?breathing but noted to have agonal respirations in the ED and immediately intubated. Her neurologic examination is notable for present  brainstem reflexes except for unable to elicit cough and withdraws to pain in all extremities. I do have concerns for potential anoxic injury based on the presentation and labs with lactate of 11 and pH of 7.15.   As for the movements noted, she does have BL hand and forearms twitching along with occasional facial twitches but these appear somewhat random to me. I did note a period where her R atm twitching seemed rhythmic for a few secs. They clinically appear undifferentiated and difficult to rule out seizures, so will start her on Ceribell EEG with plan to do full cEEG in AM.  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  Recommendations  - Will do Keppra $RemoveB'500mg'hwtdPKbb$  once to bring the loading dose to 1G. - Continue Keppra $RemoveBeforeD'500mg'NdaeJVdfjjIpoU$  BID - Ceribell EEG overnight and transition to cEEG in AM. No seizures on Ceribell EEG so far. - MRI Brain without contrast after cEEG - Avoid hyperthermia, hyponatremia, hypotension.   ___________________________________________________________________  This patient is critically ill and at significant risk of neurological worsening, death and care requires constant monitoring of vital signs, hemodynamics,respiratory and cardiac monitoring, neurological assessment, discussion with family, other specialists and medical decision making of high complexity. I spent 40 minutes of neurocritical care time  in the care of  this patient. This was time spent independent of any time provided by nurse practitioner or PA.  Donnetta Simpers [facility] Pager Number [phone] 03/23/2021  11:36 PM    Thank you for the opportunity to take part in the care of this patient. If you have any further questions, please contact the neurology consultation attending.  Signed,  [place] Pager Number [phone] _ _ _   _ __   _ __ _ _  __ __   _ __   __ _

## 2021-03-20 NOTE — ED Notes (Signed)
Once set of blood cultures obtained. Dr Estell Harpin ok to start abx with one set of blood cultures.

## 2021-03-20 NOTE — Progress Notes (Signed)
Ceribell placed on pt.

## 2021-03-20 NOTE — Progress Notes (Signed)
Pharmacy Antibiotic Note  Mary Lynch is a 80 y.o. female admitted on 03/16/2021 with sepsis.  Pharmacy has been consulted for Vancomycin and Aztreonam dosing. AKI  Plan: Vancomycin 1gm IV loading dose, then 500mg  IV Q48hrs for AUC 546 Aztreonam 2gm IV loading dose, then 500mg  IV q8h F/U cxs and clinical progress Monitor V/S, labs and levels as indicated  Height: 5' (152.4 cm) Weight: 54.4 kg (120 lb) IBW/kg (Calculated) : 45.5  Temp (24hrs), Avg:93.6 F (34.2 C), Min:92.5 F (33.6 C), Max:95 F (35 C)  Recent Labs  Lab 03/14/2021 1608 03/02/2021 1742  WBC 8.5  --   CREATININE 3.75*  --   LATICACIDVEN >11.0* 10.2*    Estimated Creatinine Clearance: 8.6 mL/min (A) (by C-G formula based on SCr of 3.75 mg/dL (H)).    Allergies  Allergen Reactions   Penicillin G Rash    .Has patient had a PCN reaction causing immediate rash, facial/tongue/throat swelling, SOB or lightheadedness with hypotension: yes Has patient had a PCN reaction causing severe rash involving mucus membranes or skin necrosis: no Has patient had a PCN reaction that required hospitalization: yes Has patient had a PCN reaction occurring within the last 10 years: no If all of the above answers are "NO", then may proceed with Cephalosporin use    Antimicrobials this admission: Vancomycin  7/24 >>  Aztreonam 7/24 >>   Microbiology results: 7/24 BCx: pending 7/24 UCx: pending   MRSA PCR:   Thank you for allowing pharmacy to be a part of this patient's care.  8/24, BS 8/24, Mary Lynch Clinical Pharmacist Pager [phone]  02/27/2021 7:52 PM

## 2021-03-20 NOTE — ED Provider Notes (Signed)
I spoke to the husband again and notified him that she was going to [facility].  I discussed making her a DNR and the husband agrees to make his wife Mary Lynch a DO NOT RESUSCITATE   [name], MD 03/09/2021 2038

## 2021-03-20 NOTE — ED Notes (Signed)
Date and time results received: 2021/04/09 1754 (use smartphrase ".now" to insert current time)  Test: VBG pH Critical Value: 7.158  Name of Provider Notified: Estell Harpin, MD  Orders Received? Or Actions Taken?:

## 2021-03-20 NOTE — ED Notes (Signed)
Propofol stopped. MD at bedside. bp currently 62/51

## 2021-03-20 NOTE — ED Notes (Signed)
Pt intubated with 7.0 ET tube 24 at lip by Dr Estell Harpin x 1 attempt without difficulty. + Color change on CO2 detector, bilateral breath sounds noted by Dr Estell Harpin.

## 2021-03-20 NOTE — ED Provider Notes (Signed)
Patient's husband has been notified on how ill the patient is.  We will notify him when the patient leaves to go to [facility].  His phone number is [phone]   [name], MD 03-25-21 1906

## 2021-03-20 NOTE — ED Provider Notes (Signed)
[facility] EMERGENCY DEPARTMENT Provider Note   CSN: [id number] Arrival date & time: 04-09-21  1511     History Chief Complaint  Patient presents with   Unresponsive    Mary Lynch is a 80 y.o. female.  Patient has severe dementia.  She also has a history of seizures.  She stays at an assisted living and was found unresponsive and pulseless.  CPR was started.  When the paramedics arrived she did have a pulse with a blood pressure of 50 and she was having agonal breaths she was transported to the emergency department  The history is provided by the EMS personnel and the nursing home. A language interpreter was used.  Cardiac Arrest Witnessed by:  Not witnessed Incident location: Nursing home. Time since incident: Unknown. Time before BLS initiated: Unknown. 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     Family History  Problem Relation Age of Onset   Alzheimer's disease Father     Social History   Tobacco Use   Smoking status: Never   Smokeless tobacco: Never  Substance Use Topics   Alcohol use: No   Drug use: No    Home Medications Prior to Admission medications   Medication Sig Start Date End Date Taking? Authorizing Provider  Amantadine HCl 100 MG tablet Take 150 mg by mouth in the morning and at bedtime. 02/10/21  Yes [provider]   Cholecalciferol (VITAMIN D3) 10 MCG (400 UNIT) CAPS Take 1 capsule by mouth daily.   Yes [provider]  clonazePAM (KLONOPIN) 0.5 MG disintegrating tablet Take 1 tablet (0.5 mg total) by mouth 3 (three) times daily for 7 days. 02/11/21 2021/04/09 Yes Couture, Cortni S, PA-C  donepezil (ARICEPT ODT) 10 MG disintegrating tablet Take 10 mg by mouth at bedtime. 01/25/21  Yes [provider]  ergocalciferol (VITAMIN D2) 1.25 MG (50000 UT) capsule Take 50,000 Units by mouth once a week.   Yes [provider]  levETIRAcetam (KEPPRA) 250 MG tablet Take 250 mg by mouth daily at 12 noon. Take 1 tablet at noon and then take 500 mg twice a day   Yes [provider]  levETIRAcetam (KEPPRA) 500 MG tablet Take 1 tablet (500 mg total) by mouth 2 (two) times daily. Patient taking differently: Take 500 mg by mouth daily. Take 250 mg at 12 noon and 500 mg twice a day 02/11/21 04/09/2021 Yes Couture, Cortni S, PA-C  loratadine (CLARITIN) 10 MG tablet Take 10 mg by mouth daily as needed for allergies.   Yes [provider]  mirtazapine (REMERON) 15 MG tablet Take 15 mg by mouth at bedtime.   Yes [provider]  Multiple Vitamin (MULTIVITAMIN) capsule Take 1 capsule by mouth daily.   Yes [provider]  pyridoxine (B-6) 100 MG tablet Take 100 mg by mouth  daily.   Yes [provider]  sertraline (ZOLOFT) 50 MG tablet Take 25 mg by mouth daily.   Yes [provider]  cephALEXin (KEFLEX) 500 MG capsule Take 500 mg by mouth 4 (four) times daily. Patient not taking: Reported on 07-09-2021    [provider]    Allergies    Penicillin g  Review of Systems   Review of Systems  Unable to perform ROS: Unstable vital signs   Physical Exam Updated Vital Signs BP 96/72   Pulse 67   Temp (!) 93.7 F (34.3 C)   Resp (!) 26   Ht 5' (1.524 m)   Wt 54.4 kg   SpO2 100%   BMI 23.44 kg/m   Physical Exam Vitals reviewed.   Constitutional:      Appearance: She is well-developed.     Comments: Not responding to verbal or painful stimuli  HENT:     Head: Normocephalic.     Comments: Poor Reflex    Nose: Nose normal.  Eyes:     General: No scleral icterus.    Conjunctiva/sclera: Conjunctivae normal.  Neck:     Thyroid: No thyromegaly.  Cardiovascular:     Rate and Rhythm: Normal rate and regular rhythm.     Heart sounds: No murmur heard.   No friction rub. No gallop.  Pulmonary:     Breath sounds: No stridor. No wheezing or rales.     Comments: Patient with agonal breath Chest:     Chest wall: No tenderness.  Abdominal:     General: There is no distension.     Tenderness: There is no abdominal tenderness. There is no rebound.  Musculoskeletal:        General: Normal range of motion.     Cervical back: Neck supple.  Lymphadenopathy:     Cervical: No cervical adenopathy.  Skin:    Findings: No erythema or rash.  Neurological:     Motor: No abnormal muscle tone.     Coordination: Coordination normal.     Comments: Patient not moving any extremities to painful stimuli    ED Results / Procedures / Treatments   Labs (all labs ordered are listed, but only abnormal results are displayed) Labs Reviewed  LACTIC ACID, PLASMA - Abnormal; Notable for the following components:      Result Value   Lactic Acid, Venous >11.0 (*)    All other components within normal limits  LACTIC ACID, PLASMA - Abnormal; Notable for the following components:   Lactic Acid, Venous 10.2 (*)    All other components within normal limits  COMPREHENSIVE METABOLIC PANEL - Abnormal; Notable for the following components:   Sodium 157 (*)    Chloride 129 (*)    CO2 10 (*)    Glucose, Bld 45 (*)    BUN 104 (*)    Creatinine, Ser 3.75 (*)    Calcium 6.9 (*)    Total Protein 3.9 (*)    Albumin 2.1 (*)    AST 932 (*)    ALT 905 (*)    Total Bilirubin 1.3 (*)    GFR, Estimated 12 (*)    Anion gap 18 (*)    All other  components within normal limits  CBC WITH DIFFERENTIAL/PLATELET - Abnormal; Notable for the following components:   RBC 3.77 (*)    MCV 108.2 (*)    MCHC 29.9 (*)    Platelets 107 (*)    Abs Immature Granulocytes 0.16 (*)  All other components within normal limits  PROTIME-INR - Abnormal; Notable for the following components:   Prothrombin Time 25.9 (*)    INR 2.4 (*)    All other components within normal limits  APTT - Abnormal; Notable for the following components:   aPTT 45 (*)    All other components within normal limits  URINALYSIS, ROUTINE W REFLEX MICROSCOPIC - Abnormal; Notable for the following components:   Color, Urine AMBER (*)    APPearance TURBID (*)    Hgb urine dipstick SMALL (*)    Protein, ur 100 (*)    Leukocytes,Ua MODERATE (*)    RBC / HPF >50 (*)    WBC, UA >50 (*)    Bacteria, UA MANY (*)    All other components within normal limits  BLOOD GAS, VENOUS - Abnormal; Notable for the following components:   pH, Ven 7.158 (*)    pCO2, Ven 39.9 (*)    Bicarbonate 13.0 (*)    Acid-base deficit 13.4 (*)    All other components within normal limits  CBG MONITORING, ED - Abnormal; Notable for the following components:   Glucose-Capillary 107 (*)    All other components within normal limits  RESP PANEL BY RT-PCR (FLU A&B, COVID) ARPGX2  CULTURE, BLOOD (ROUTINE X 2)  URINE CULTURE    EKG None  Radiology CT Head Wo Contrast  Result Date: 02/28/2021 CLINICAL DATA:  Found down and unresponsive at the nursing home today. Patient intubated. EXAM: CT HEAD WITHOUT CONTRAST TECHNIQUE: Contiguous axial images were obtained from the base of the skull through the vertex without intravenous contrast. COMPARISON:  02/11/2021 FINDINGS: Brain: No evidence of acute infarction, hemorrhage, hydrocephalus, extra-axial collection or mass lesion/mass effect. There is ventricular sulcal enlargement reflecting moderate atrophy. Patchy white matter hypoattenuation is also noted  consistent with moderate chronic microvascular ischemic change. These findings are stable. Vascular: No hyperdense vessel or unexpected calcification. Skull: Normal. Negative for fracture or focal lesion. Sinuses/Orbits: Globes and orbits are unremarkable. Sinuses are clear. Other: None. IMPRESSION: 1. No acute intracranial abnormalities. 2. Atrophy and chronic microvascular ischemic change stable from the recent prior study. Electronically Signed   By: Amie Portland M.D.   On: 03/07/2021 17:28   DG Chest Port 1 View  Result Date: 03/07/2021 CLINICAL DATA:  Status post intubation. EXAM: PORTABLE CHEST 1 VIEW COMPARISON:  February 11, 2021. FINDINGS: The heart size and mediastinal contours are within normal limits. Endotracheal tube is in grossly good position. Both lungs are clear. The visualized skeletal structures are unremarkable. IMPRESSION: Endotracheal tube in grossly good position. No acute cardiopulmonary abnormality seen. Electronically Signed   By: Lupita Raider M.D.   On: 03/16/2021 16:02    Procedures Procedure Name: Intubation Date/Time: 03/04/2021 7:02 PM Performed by: [name], MD Pre-anesthesia Checklist: Patient identified Oxygen Delivery Method: Non-rebreather mask Preoxygenation: Pre-oxygenation with 100% oxygen Ventilation: Mask ventilation without difficulty Laryngoscope Size: 1 Grade View: Grade I Tube size: 7.0 mm Number of attempts: 1 Comments: Patient was intubated with a 7 cm tube.  No sedation was necessary because patient was having agonal breaths.  Glide scope was used.  She was assessed in place by auscultation and CO2.  Tube was placed in the first try        Medications Ordered in ED Medications  lactated ringers infusion ( Intravenous New Bag/Given 03/01/2021 1624)  etomidate (AMIDATE) injection 20 mg (20 mg Intravenous Not Given 03/05/2021 1535)  succinylcholine (ANECTINE) injection 100 mg (100 mg Intravenous  Not Given 04-09-2021 1535)  propofol (DIPRIVAN)  1000 MG/100ML infusion (0 mcg/kg/min  54.4 kg Intravenous Stopped Apr 09, 2021 1652)  norepinephrine (LEVOPHED) 4mg  in premix infusion (5 mcg/min Intravenous Rate/Dose Change 04-09-21 1847)  naloxone (NARCAN) injection 2 mg (2 mg Intravenous Given April 09, 2021 1519)  lactated ringers bolus 1,000 mL (0 mLs Intravenous Stopped 2021-04-09 1558)    And  lactated ringers bolus 500 mL (0 mLs Intravenous Stopped 2021-04-09 1623)    And  lactated ringers bolus 250 mL (0 mLs Intravenous Stopped 04/09/2021 1623)  aztreonam (AZACTAM) 2 g in sodium chloride 0.9 % 100 mL IVPB (0 g Intravenous Stopped Apr 09, 2021 1629)  metroNIDAZOLE (FLAGYL) IVPB 500 mg (0 mg Intravenous Stopped 04/09/2021 1731)  vancomycin (VANCOCIN) IVPB 1000 mg/200 mL premix (0 mg Intravenous Stopped Apr 09, 2021 1731)  levETIRAcetam (KEPPRA) IVPB 500 mg/100 mL premix (0 mg Intravenous Stopped April 09, 2021 1726)  sodium chloride 0.9 % bolus 2,000 mL (2,000 mLs Intravenous New Bag/Given 2021-04-09 1725)  dextrose 50 % solution 50 mL (50 mLs Intravenous Given 09-Apr-2021 1707)    ED Course  I have reviewed the triage vital signs and the nursing notes.  Pertinent labs & imaging results that were available during my care of the patient were reviewed by me and considered in my medical decision making (see chart for details). CRITICAL CARE Performed by: 03/04/2021 Total critical care time: 65 minutes Critical care time was exclusive of separately billable procedures and treating other patients. Critical care was necessary to treat or prevent imminent or life-threatening deterioration. Critical care was time spent personally by me on the following activities: development of treatment plan with patient and/or surrogate as well as nursing, discussions with consultants, evaluation of patient's response to treatment, examination of patient, obtaining history from patient or surrogate, ordering and performing treatments and interventions, ordering and review of laboratory  studies, ordering and review of radiographic studies, pulse oximetry and re-evaluation of patient's condition.  Patient with cardiopulmonary arrest.  Patient was intubated and has been treated for sepsis protocol.  Critical care at [facility] has accepted the patient MDM Rules/Calculators/A&P                          Cardiopulmonary arrest with intubation and urinary tract infection with possible sepsis  Final Clinical Impression(s) / ED Diagnoses Final diagnoses:  None    Rx / DC Orders ED Discharge Orders     None        [facility], MD 2021/04/09 1905

## 2021-03-21 ENCOUNTER — Inpatient Hospital Stay (HOSPITAL_COMMUNITY): Payer: Medicare Other

## 2021-03-21 DIAGNOSIS — J9601 Acute respiratory failure with hypoxia: Secondary | ICD-10-CM

## 2021-03-21 DIAGNOSIS — E870 Hyperosmolality and hypernatremia: Secondary | ICD-10-CM

## 2021-03-21 DIAGNOSIS — I469 Cardiac arrest, cause unspecified: Secondary | ICD-10-CM | POA: Diagnosis not present

## 2021-03-21 DIAGNOSIS — L8990 Pressure ulcer of unspecified site, unspecified stage: Secondary | ICD-10-CM | POA: Insufficient documentation

## 2021-03-21 DIAGNOSIS — E872 Acidosis, unspecified: Secondary | ICD-10-CM

## 2021-03-21 DIAGNOSIS — A419 Sepsis, unspecified organism: Secondary | ICD-10-CM | POA: Diagnosis not present

## 2021-03-21 DIAGNOSIS — G931 Anoxic brain damage, not elsewhere classified: Secondary | ICD-10-CM | POA: Diagnosis not present

## 2021-03-21 DIAGNOSIS — Z87898 Personal history of other specified conditions: Secondary | ICD-10-CM

## 2021-03-21 LAB — POCT I-STAT 7, (LYTES, BLD GAS, ICA,H+H)
Acid-base deficit: 10 mmol/L — ABNORMAL HIGH (ref 0.0–2.0)
Acid-base deficit: 12 mmol/L — ABNORMAL HIGH (ref 0.0–2.0)
Bicarbonate: 12.1 mmol/L — ABNORMAL LOW (ref 20.0–28.0)
Bicarbonate: 14.7 mmol/L — ABNORMAL LOW (ref 20.0–28.0)
Calcium, Ion: 1.02 mmol/L — ABNORMAL LOW (ref 1.15–1.40)
Calcium, Ion: 1.03 mmol/L — ABNORMAL LOW (ref 1.15–1.40)
HCT: 38 % (ref 36.0–46.0)
HCT: 41 % (ref 36.0–46.0)
Hemoglobin: 12.9 g/dL (ref 12.0–15.0)
Hemoglobin: 13.9 g/dL (ref 12.0–15.0)
O2 Saturation: 92 %
O2 Saturation: 99 %
Patient temperature: 34.8
Patient temperature: 35.1
Potassium: 4.7 mmol/L (ref 3.5–5.1)
Potassium: 4.7 mmol/L (ref 3.5–5.1)
Sodium: 159 mmol/L — ABNORMAL HIGH (ref 135–145)
Sodium: 161 mmol/L (ref 135–145)
TCO2: 13 mmol/L — ABNORMAL LOW (ref 22–32)
TCO2: 16 mmol/L — ABNORMAL LOW (ref 22–32)
pCO2 arterial: 21.7 mmHg — ABNORMAL LOW (ref 32.0–48.0)
pCO2 arterial: 25.9 mmHg — ABNORMAL LOW (ref 32.0–48.0)
pH, Arterial: 7.345 — ABNORMAL LOW (ref 7.350–7.450)
pH, Arterial: 7.351 (ref 7.350–7.450)
pO2, Arterial: 127 mmHg — ABNORMAL HIGH (ref 83.0–108.0)
pO2, Arterial: 60 mmHg — ABNORMAL LOW (ref 83.0–108.0)

## 2021-03-21 LAB — LACTIC ACID, PLASMA
Lactic Acid, Venous: 6.2 mmol/L (ref 0.5–1.9)
Lactic Acid, Venous: 4.8 mmol/L (ref 0.5–1.9)

## 2021-03-21 LAB — CBC
HCT: 50.5 % — ABNORMAL HIGH (ref 36.0–46.0)
Hemoglobin: 15.7 g/dL — ABNORMAL HIGH (ref 12.0–15.0)
MCH: 31.8 pg (ref 26.0–34.0)
MCHC: 31.1 g/dL (ref 30.0–36.0)
MCV: 102.2 fL — ABNORMAL HIGH (ref 80.0–100.0)
Platelets: UNDETERMINED 10*3/uL (ref 150–400)
RBC: 4.94 MIL/uL (ref 3.87–5.11)
RDW: 14.2 % (ref 11.5–15.5)
WBC: 13.7 10*3/uL — ABNORMAL HIGH (ref 4.0–10.5)
nRBC: 0.1 % (ref 0.0–0.2)

## 2021-03-21 LAB — GLUCOSE, CAPILLARY
Glucose-Capillary: 153 mg/dL — ABNORMAL HIGH (ref 70–99)
Glucose-Capillary: 108 mg/dL — ABNORMAL HIGH (ref 70–99)
Glucose-Capillary: 119 mg/dL — ABNORMAL HIGH (ref 70–99)

## 2021-03-21 LAB — COMPREHENSIVE METABOLIC PANEL
ALT: 4230 U/L — ABNORMAL HIGH (ref 0–44)
AST: 7178 U/L — ABNORMAL HIGH (ref 15–41)
Albumin: 2.6 g/dL — ABNORMAL LOW (ref 3.5–5.0)
Alkaline Phosphatase: 92 U/L (ref 38–126)
Anion gap: 16 — ABNORMAL HIGH (ref 5–15)
BUN: 108 mg/dL — ABNORMAL HIGH (ref 8–23)
CO2: 14 mmol/L — ABNORMAL LOW (ref 22–32)
Calcium: 6.8 mg/dL — ABNORMAL LOW (ref 8.9–10.3)
Chloride: 129 mmol/L — ABNORMAL HIGH (ref 98–111)
Creatinine, Ser: 3.89 mg/dL — ABNORMAL HIGH (ref 0.44–1.00)
GFR, Estimated: 11 mL/min — ABNORMAL LOW (ref >60)
Glucose, Bld: 164 mg/dL — ABNORMAL HIGH (ref 70–99)
Potassium: 4.9 mmol/L (ref 3.5–5.1)
Sodium: 159 mmol/L — ABNORMAL HIGH (ref 135–145)
Total Bilirubin: 1.9 mg/dL — ABNORMAL HIGH (ref 0.3–1.2)
Total Protein: 5 g/dL — ABNORMAL LOW (ref 6.5–8.1)

## 2021-03-21 LAB — DIC (DISSEMINATED INTRAVASCULAR COAGULATION)PANEL
D-Dimer, Quant: 8.25 ug/mL-FEU — ABNORMAL HIGH (ref 0.00–0.50)
Fibrinogen: 312 mg/dL (ref 210–475)
INR: 2.3 — ABNORMAL HIGH (ref 0.8–1.2)
Platelets: UNDETERMINED 10*3/uL (ref 150–400)
Prothrombin Time: 25.4 seconds — ABNORMAL HIGH (ref 11.4–15.2)
Smear Review: NONE SEEN
aPTT: 38 seconds — ABNORMAL HIGH (ref 24–36)

## 2021-03-21 LAB — MAGNESIUM: Magnesium: 2.4 mg/dL (ref 1.7–2.4)

## 2021-03-21 LAB — OSMOLALITY, URINE: Osmolality, Ur: 383 mOsm/kg (ref 300–900)

## 2021-03-21 LAB — CREATININE, URINE, RANDOM: Creatinine, Urine: 45.06 mg/dL

## 2021-03-21 LAB — ECHOCARDIOGRAM COMPLETE
Area-P 1/2: 3.27 cm2
Calc EF: 50.8 %
Height: 60 in
S' Lateral: 2 cm
Single Plane A2C EF: 52 %
Single Plane A4C EF: 50.9 %
Weight: 1428.58 oz

## 2021-03-21 LAB — TROPONIN I (HIGH SENSITIVITY): Troponin I (High Sensitivity): 71 ng/L — ABNORMAL HIGH (ref <18)

## 2021-03-21 LAB — AMMONIA: Ammonia: 70 umol/L — ABNORMAL HIGH (ref 9–35)

## 2021-03-21 LAB — OSMOLALITY: Osmolality: 362 mOsm/kg (ref 275–295)

## 2021-03-21 LAB — TRIGLYCERIDES: Triglycerides: 124 mg/dL (ref <150)

## 2021-03-21 LAB — PHOSPHORUS: Phosphorus: 6.2 mg/dL — ABNORMAL HIGH (ref 2.5–4.6)

## 2021-03-21 LAB — SODIUM, URINE, RANDOM: Sodium, Ur: 43 mmol/L

## 2021-03-21 MED ORDER — GLYCOPYRROLATE 0.2 MG/ML IJ SOLN: 0.2000 mg | INTRAMUSCULAR | Status: DC | PRN

## 2021-03-21 MED ORDER — DEXTROSE 5 % IV SOLN: INTRAVENOUS | Status: DC

## 2021-03-21 MED ORDER — SODIUM CHLORIDE 0.9 % IV SOLN
250.0000 mg | INTRAVENOUS | Status: DC
  Administered 2021-03-21: 250 mg via INTRAVENOUS
  Filled 2021-03-21 (×2): qty 2.5

## 2021-03-21 MED ORDER — DEXMEDETOMIDINE HCL IN NACL 400 MCG/100ML IV SOLN: 0.4000 ug/kg/h | INTRAVENOUS | Status: DC

## 2021-03-21 MED ORDER — GLYCOPYRROLATE 1 MG PO TABS: 1.0000 mg | ORAL_TABLET | ORAL | Status: DC | PRN

## 2021-03-21 MED ORDER — ACETAMINOPHEN 325 MG PO TABS: 650.0000 mg | ORAL_TABLET | Freq: Four times a day (QID) | ORAL | Status: DC | PRN

## 2021-03-21 MED ORDER — POLYVINYL ALCOHOL 1.4 % OP SOLN
1.0000 [drp] | Freq: Four times a day (QID) | OPHTHALMIC | Status: DC | PRN
  Filled 2021-03-21: qty 15

## 2021-03-21 MED ORDER — SODIUM CHLORIDE 0.9 % IV SOLN
INTRAVENOUS | Status: DC | PRN
  Administered 2021-03-21: 250 mL via INTRAVENOUS

## 2021-03-21 MED ORDER — FREE WATER: 200.0000 mL | Status: DC

## 2021-03-21 MED ORDER — LEVETIRACETAM IN NACL 500 MG/100ML IV SOLN
500.0000 mg | Freq: Two times a day (BID) | INTRAVENOUS | Status: DC
  Administered 2021-03-21 (×2): 500 mg via INTRAVENOUS
  Filled 2021-03-21 (×2): qty 100

## 2021-03-21 MED ORDER — ACETAMINOPHEN 650 MG RE SUPP: 650.0000 mg | Freq: Four times a day (QID) | RECTAL | Status: DC | PRN

## 2021-03-21 MED ORDER — DIPHENHYDRAMINE HCL 50 MG/ML IJ SOLN: 25.0000 mg | INTRAMUSCULAR | Status: DC | PRN

## 2021-03-21 MED ORDER — MORPHINE SULFATE (PF) 2 MG/ML IV SOLN: 2.0000 mg | INTRAVENOUS | Status: DC | PRN

## 2021-03-21 MED ORDER — VITAL 1.5 CAL PO LIQD: 1000.0000 mL | ORAL | Status: DC

## 2021-03-21 MED ORDER — SODIUM CHLORIDE 0.9 % IV SOLN
2.0000 g | INTRAVENOUS | Status: DC
  Administered 2021-03-21: 2 g via INTRAVENOUS
  Filled 2021-03-21: qty 20

## 2021-03-21 NOTE — Progress Notes (Signed)
NAME:  Mary Lynch, MRN:  [id number], DOB:  06/26/1941, LOS: 1 ADMISSION DATE:  03/02/2021, CONSULTATION DATE:  7/24 REFERRING MD:  Estell HarpinZammit MD, CHIEF COMPLAINT:  Cardiac Arrest   History of Present Illness:  Patient is encephalopathic and/or intubated. Therefore history has been obtained from chart review.   Mary Lynch ,is a 80 y.o. female, who presented to the [facility] with a chief complaint of cardiac arrest.  They have a pertinent past medical history of seizures and dementia  Per chart review, Mary Lynch is a resident of an assisted living facility. She was found unresponsive and pulseless. There is reported unknown downtime and unknown time until ACLS initation. 30 compressions with patient found to be breathing.  On arrival to the [facility] ED she was found to be agonally breathing and was intubated. 3.7 L IVF was given. BC and UA were obtained. She was given a dose of vanc, flagyl, and aztreonam. 500mg  keppra given. Head CT was negative for acute process. Initial lactate greater than 11, creatine 3.75, ast 932, alt 905, INR 1.4, BG 43, ABG 7.15/39.9/42/13 on 100%. 12 lead with scant inferior st depression.  PCCM was asked to admit the patient  Pertinent  Medical History   Seizures and dementia  Significant Hospital Events: Including procedures, antibiotic start and stop dates in addition to other pertinent events   7/24 Cardiac arrest. Presented to [facility] ED. Admit  Interim History / Subjective:  Intubated remains unresponsive, intermittent myoclonic jerking.   Objective   Blood pressure 100/79, pulse 65, temperature (!) 95.18 F (35.1 C), resp. rate (!) 22, height 5' (1.524 m), weight 40.5 kg, SpO2 100 %.    Vent Mode: PRVC FiO2 (%):  [70 %-100 %] 70 % Set Rate:  [18 bmp] 18 bmp Vt Set:  [360 mL] 360 mL PEEP:  [5 cmH20] 5 cmH20 Plateau Pressure:  [11 cmH20-136 cmH20] 136 cmH20   Intake/Output Summary (Last 24 hours) at 03/21/2021 0827 Last data filed at 03/21/2021  0600 Gross per 24 hour  Intake 4147.45 ml  Output 450 ml  Net 3697.45 ml    Filed Weights   03/13/2021 1517 03/21/21 0412  Weight: 54.4 kg 40.5 kg    Examination: General:  frail, in bed no acute distress HEENT: MM pink/moist, anicteric, atraumatic Neuro: no eye opening, withdraws to pain, RASS -4, rhythmic jerking of shoulder and arms  CV: S1S2, NSR, no m/r/g appreciated PULM:  clear in the upper lobes and in the lower lobes, Trachea midline, chest expansion symmetric GI: soft, bsx4 hypoactive, non distended  Extremities: warm/dry, no pretibial edema, capillary refill less than 3 seconds  Skin: no rashes, DTI on sacrum  Resolved Hospital Problem list     Assessment & Plan:  Cardiac arrest- per documentation at OSH. Found responsive and pulseless. Unknown downtime and unknown time until CPR start. Per nursing note. 30 compressions with patient found to be breathing. Unclear if this was agonal respirations. Found to be agonally breathing per ED provider. EKG with small ST depression in inferior leads. Suspect secondary to acidosis. Septic shock secondary to UTI- Urine culture pending AGMA- suspect secondary to lactic acidosis.  Remains on 10 mg Levo this morning. Lactate trending down to 6.2. -Goal MAP 65 or greater. On levophed ordered.  -Obtain/Follow up BC and UC -Continue on vancomycin and aztreonam -Trend lactate - Follow up echo - continue on ventilatory support   Acute Metabolic Encephalopathy vs ?Anoxic Brain Injury Seizures  Secondary to cardiac arrest,  sepsis, and metabolic derangements. Head CT 7/24 negative for acute process. Loaded on keppra. No seizures on Ceribell EEG, cEEG placed this morning -Will consult neurology. EEG per neurology -MRI after cEEG -Continue propofol -Keppra 500 mg twice daily  Acute respiratory failure with hypoxemia -repeat ABG -LTVV strategy with tidal volumes of 4-8 cc/kg ideal body weight -Goal plateau pressures of 30 and driving  pressures of 15 -Wean PEEP/FiO2 for SpO2 92% - 98% -VAP bundle -Daily SAT and SBT -propofol for sedation  Hypernatremia Na up to 159 this morning with Cl 129.  Free water deficit 2.7L. May be contributing to encephalopathy - Free water 200 mL q4 per tube - Stop IVF - Osms, Uosms, urine sodium, although has been getting IV fluids - Monitor BMP  AKI Creatinine 3.75>3.89. BUN 108. Suspect secondary to cardiac arrest, hypotension and sepsis.  -Ensure renal perfusion. Goal MAP 65 or greater. -Avoid neprotoxic drugs as possible. -Strict I&O's -Monitor renal function  Shock Liver Worsening AST, ALT.  INR 2.3 bili 1.9, albumin 2.6 secondary to cardiac arrest, sepsis shock -Goal map greater than 65 -Monitor on CMP -Supportive care  Thrombocytopenia Plt 107 on admission, now with clumping, no obvious bleeding. DIC panel with  -CBC in am -Will need to discuss with blood products with husband/religous leaders if platelets drop or she develops bleeding  DTI- present on admission -wound care per nursing  H/X dementia -Holding home meds   J[employer]ov[employer] Witness Patient-No blood products -Limit blood draws and proceedures as able  Best Practice (right click and "Reselect all SmartList Selections" daily)  Diet/type: NPO, tube feeds DVT prophylaxis: SCD GI prophylaxis: PPI Lines: N/A Foley:  Yes, and it is still needed Code Status:  DNR Last date of multidisciplinary goals of care discussion [Spoke with husband Jaslynn Thome at [phone]. 7/24, patient does have living will, instructed to bring. Husband states that "she would not want to be a vegetable"]  Labs   CBC: Recent Labs  Lab 16-Apr-2021 1608 03/21/21 0031 03/21/21 0245  WBC 8.5  --  13.7*  NEUTROABS 7.2  --   --   HGB 12.2 12.9 15.7*  HCT 40.8 38.0 50.5*  MCV 108.2*  --  102.2*  PLT 107*  --  PLATELET CLUMPS NOTED ON SMEAR, UNABLE TO ESTIMATE  PLATELET CLUMPS NOTED ON SMEAR, UNABLE TO ESTIMATE     Basic  Metabolic Panel: Recent Labs  Lab 04/16/2021 1608 03/21/21 0031 03/21/21 0245  NA 157* 159* 159*  K 4.8 4.7 4.9  CL 129*  --  129*  CO2 10*  --  14*  GLUCOSE 45*  --  164*  BUN 104*  --  108*  CREATININE 3.75*  --  3.89*  CALCIUM 6.9*  --  6.8*  MG  --   --  2.4  PHOS  --   --  6.2*    GFR: Estimated Creatinine Clearance: 7.4 mL/min (A) (by C-G formula based on SCr of 3.89 mg/dL (H)). Recent Labs  Lab 2021-04-16 1608 04-16-2021 1742 03/21/21 0245  WBC 8.5  --  13.7*  LATICACIDVEN >11.0* 10.2* 6.2*     Liver Function Tests: Recent Labs  Lab 04-16-2021 1608 03/21/21 0245  AST 932* 7,178*  ALT 905* 4,230*  ALKPHOS 67 92  BILITOT 1.3* 1.9*  PROT 3.9* 5.0*  ALBUMIN 2.1* 2.6*    No results for input(s): LIPASE, AMYLASE in the last 168 hours. Recent Labs  Lab 03/21/21 0245  AMMONIA 70*    ABG    Component  Value Date/Time   PHART 7.345 (L) 03/21/2021 0031   PCO2ART 21.7 (L) 03/21/2021 0031   PO2ART 60 (L) 03/21/2021 0031   HCO3 12.1 (L) 03/21/2021 0031   TCO2 13 (L) 03/21/2021 0031   ACIDBASEDEF 12.0 (H) 03/21/2021 0031   O2SAT 92.0 03/21/2021 0031      Coagulation Profile: Recent Labs  Lab Mar 30, 2021 1608 03/21/21 0245  INR 2.4* 2.3*     Cardiac Enzymes: No results for input(s): CKTOTAL, CKMB, CKMBINDEX, TROPONINI in the last 168 hours.  HbA1C: No results found for: HGBA1C  CBG: Recent Labs  Lab March 30, 2021 1752 03/30/21 2155 03-30-2021 2335 03/21/21 0336 03/21/21 0728  GLUCAP 107* 139* 139* 153* 108*     Review of Systems:   Unable to obtain ROS evaluation due to patient status   Past Medical History:  She,  has a past medical history of Dementia (HCC) and Seizures (HCC).   Surgical History:   Past Surgical History:  Procedure Laterality Date   BACK SURGERY       Social History:   reports that she has never smoked. She has never used smokeless tobacco. She reports that she does not drink alcohol and does not use drugs.   Family  History:  Her family history includes Alzheimer's disease in her father.   Allergies Allergies  Allergen Reactions   Penicillin G Rash    .Has patient had a PCN reaction causing immediate rash, facial/tongue/throat swelling, SOB or lightheadedness with hypotension: yes Has patient had a PCN reaction causing severe rash involving mucus membranes or skin necrosis: no Has patient had a PCN reaction that required hospitalization: yes Has patient had a PCN reaction occurring within the last 10 years: no If all of the above answers are "NO", then may proceed with Cephalosporin use     Home Medications  Prior to Admission medications   Medication Sig Start Date End Date Taking? Authorizing Provider  Amantadine HCl 100 MG tablet Take 150 mg by mouth in the morning and at bedtime. 02/10/21  Yes [provider]  Cholecalciferol (VITAMIN D3) 10 MCG (400 UNIT) CAPS Take 1 capsule by mouth daily.   Yes [provider]  clonazePAM (KLONOPIN) 0.5 MG disintegrating tablet Take 1 tablet (0.5 mg total) by mouth 3 (three) times daily for 7 days. 02/11/21 03-30-2021 Yes Couture, Cortni S, PA-C  donepezil (ARICEPT ODT) 10 MG disintegrating tablet Take 10 mg by mouth at bedtime. 01/25/21  Yes [provider]  ergocalciferol (VITAMIN D2) 1.25 MG (50000 UT) capsule Take 50,000 Units by mouth once a week.   Yes [provider]  levETIRAcetam (KEPPRA) 250 MG tablet Take 250 mg by mouth daily at 12 noon. Take 1 tablet at noon and then take 500 mg twice a day   Yes [provider]  levETIRAcetam (KEPPRA) 500 MG tablet Take 1 tablet (500 mg total) by mouth 2 (two) times daily. Patient taking differently: Take 500 mg by mouth daily. Take 250 mg at 12 noon and 500 mg twice a day 02/11/21 2021/03/30 Yes Couture, Cortni S, PA-C  loratadine (CLARITIN) 10 MG tablet Take 10 mg by mouth daily as needed for allergies.   Yes [provider]  mirtazapine (REMERON) 15 MG tablet Take 15  mg by mouth at bedtime.   Yes [provider]  Multiple Vitamin (MULTIVITAMIN) capsule Take 1 capsule by mouth daily.   Yes [provider]  pyridoxine (B-6) 100 MG tablet Take 100 mg by mouth daily.   Yes  [provider]  sertraline (ZOLOFT) 50 MG tablet Take 25 mg by mouth daily.   Yes [provider]  cephALEXin (KEFLEX) 500 MG capsule Take 500 mg by mouth 4 (four) times daily. Patient not taking: Reported on Apr 08, 2021    [provider]     Quincy Simmonds, MD Internal medicine PGY-2  03/21/21 10:33 AM Pager [phone]

## 2021-03-21 NOTE — Plan of Care (Signed)
Pt family in to make decisions

## 2021-03-21 NOTE — Procedures (Signed)
Patient Name: Mary Lynch. 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  EEG to evaluate for seizures.  Level of alertness: Lethargic   AEDs during EEG study: LEV, propofol  Technical aspects: This EEG was obtained using a 10 lead EEG system positioned circumferentially without any parasagittal coverage (rapid EEG). Computer selected EEG is reviewed as  well as background features and all clinically significant events.  Description: No clear posterior dominant rhythm was seen.  EEG showed consider generalized 3 to 5Hz  theta- delta slowing.  Hyperventilation and photic stimulation were not performed.     ABNORMALITY - Continuous slow, generalized  IMPRESSION: This limited ceribell EEG is suggestive of moderate to severe diffuse encephalopathy, nonspecific etiology. No seizures or epileptiform discharges were seen throughout the recording.  Mary Lynch 

## 2021-03-21 NOTE — Procedures (Signed)
Patient Name: Mary Lynch. Weingartner  MRN: [id number]  Epilepsy Attending: Charlsie Quest  Referring Physician/Provider: Dr Erick Blinks Duration: 03/21/2021 0841 to 03/21/2021 1507   Patient history: 80 year old female with history of seizures on Keppra presented with altered mental status and noted to have twitching in bilateral upper extremities.  EEG to evaluate for seizures.   Level of alertness: Lethargic   AEDs during EEG study: LEV, propofol  Technical aspects: This EEG study was done with scalp electrodes positioned according to the 10-20 International system of electrode placement. Electrical activity was acquired at a sampling rate of 500Hz  and reviewed with a high frequency filter of 70Hz  and a low frequency filter of 1Hz . EEG data were recorded continuously and digitally stored.   Description:  No clear posterior dominant rhythm was seen.  EEG showed continuous generalized mixed frequencies with predominantly 5 to 10Hz  theta-alpha activity as well as intermittent 2-3Hz  delta slowing.  Hyperventilation and photic stimulation were not performed.      ABNORMALITY - Continuous slow, generalized   IMPRESSION: This study is suggestive of moderate to severe diffuse encephalopathy, nonspecific etiology. No seizures or epileptiform discharges were seen throughout the recording.   Christos Mixson 

## 2021-03-21 NOTE — Progress Notes (Signed)
eLink Physician-Brief Progress Note Patient Name: Mary Lynch DOB: 12-Jun-1941 MRN: [id number]   Date of Service  03/21/2021  HPI/Events of Note  Notified ABG result 7.34/22/60 On 18/360/80%/5 PEEP  eICU Interventions  Will maintain current vent setting     Intervention Category Major Interventions: Acid-Base disturbance - evaluation and management  Darl Pikes 03/21/2021, 1:38 AM

## 2021-03-21 NOTE — Progress Notes (Signed)
  Echocardiogram 2D Echocardiogram has been performed.  Mary Lynch 03/21/2021, 10:51 AM

## 2021-03-21 NOTE — Progress Notes (Signed)
RT note-ETT advanced 2cm per Dr. Everardo All post Herby Abraham

## 2021-03-21 NOTE — Progress Notes (Signed)
Initial Nutrition Assessment  DOCUMENTATION CODES:   Underweight, Severe malnutrition in context of chronic illness  INTERVENTION:   Once OG tube placement confirmed to be in stomach, initiate tube feeds: - Start Vital 1.5 @ 20 ml/hr and advance by 10 ml q 8 hours to goal rate of 40 ml/hr (960 ml/day)  Tube feeding regimen at goal rate provides 1440 kcal, 65 grams of protein, and 733 ml of H2O.  Monitor magnesium, potassium, and phosphorus twice daily for at least 3 days, MD to replete as needed, as pt is at risk for refeeding syndrome given severe malnutrition.  NUTRITION DIAGNOSIS:   Severe Malnutrition related to chronic illness (dementia) as evidenced by severe muscle depletion, severe fat depletion, percent weight loss (25.6% weight loss in less than 2 months).  GOAL:   Patient will meet greater than or equal to 90% of their needs  MONITOR:   Vent status, Labs, Weight trends, Skin, TF tolerance, I & O's  REASON FOR ASSESSMENT:   Ventilator, Consult Enteral/tube feeding initiation and management, Assessment of nutrition requirement/status  ASSESSMENT:   80 year old female who presented to the ED from ALF on 7/24 after being found unresponsive after a cardiac arrest. Pt required intubation in the ED. PMH of dementia, seizures. Pt admitted with septic shock secondary to UTI, AKI, shock liver, acute respiratory failure.  Discussed pt with RN and during ICU rounds. Per Neurology, pt with anoxic brain injury.  Consult received for tube feeding initiation and management. Pt with OG tube side port in esophagus per abdominal x-ray this morning. RN is aware and is going to advance OG tube and obtain new x-ray to confirm gastric placement.  Pt with free water flushes of 200 ml q 4 hours ordered by MD for hypernatremia.  No family present at time of RD visit. Reviewed weight history in chart. Pt with a 13.9 kg weight loss since 02/11/21. This is a 25.6% weight loss which is severe  and significant for timeframe. Pt meets criteria for severe malnutrition.  Pt at risk for refeeding. Will start tube feeds at trickle rate and slowly advance to goal. RN aware of plan.  Patient is currently intubated on ventilator support MV: 8.1 L/min Temp (24hrs), Avg:94.5 F (34.7 C), Min:92.5 F (33.6 C), Max:95.54 F (35.3 C)  Drips: Propofol: off per Neurology Precedex Levophed D5: 75 ml/hr  Medications reviewed and include: IV protonix, IV abx, IV keppra  Labs reviewed: sodium 161, BUN 108, creatinine 3.89, ionized calcium 1.02, phosphorus 6.2, elevated LFTs, lactic acid 4.8 CBG's: 107-153 x 24 hours  UOP: 450 ml x 12 hours I/O's: +3.9 L since admit  NUTRITION - FOCUSED PHYSICAL EXAM:  Flowsheet Row Most Recent Value  Orbital Region Severe depletion  Upper Arm Region Moderate depletion  Thoracic and Lumbar Region Severe depletion  Buccal Region Unable to assess  Temple Region Severe depletion  Clavicle Bone Region Severe depletion  Clavicle and Acromion Bone Region Severe depletion  Scapular Bone Region Unable to assess  Dorsal Hand Moderate depletion  Patellar Region Severe depletion  Anterior Thigh Region Severe depletion  Posterior Calf Region Severe depletion  Edema (RD Assessment) None  Hair Reviewed  Eyes Unable to assess  Mouth Unable to assess  Skin Reviewed  Nails Reviewed       Diet Order:   Diet Order             Diet NPO time specified  Diet effective now  EDUCATION NEEDS:   Not appropriate for education at this time  Skin:  Skin Assessment: Skin Integrity Issues: DTI: coccyx Stage I: L coccyx  Last BM:  03/21/21 medium type 7  Height:   Ht Readings from Last 1 Encounters:  Mar 23, 2021 5' (1.524 m)    Weight:   Wt Readings from Last 1 Encounters:  03/21/21 40.5 kg    BMI:  Body mass index is 17.44 kg/m.  Estimated Nutritional Needs:   Kcal:  1200-1400  Protein:  60-75 grams  Fluid:  1.2-1.4  L    Mertie Clause, MS, RD, LDN Inpatient Clinical Dietitian Please see AMiON for contact information.

## 2021-03-21 NOTE — Progress Notes (Signed)
Subjective: No acute events overnight.  Per RN, patient has intermittent bilateral shoulder twitching.  ROS: Unable to obtain due to poor mental status  Examination  Vital signs in last 24 hours: Temp:  [92.5 F (33.6 C)-95.54 F (35.3 C)] 94.8 F (34.9 C) (07/25 0900) Pulse Rate:  [37-99] 60 (07/25 0900) Resp:  [12-32] 21 (07/25 0900) BP: (43-201)/(22-190) 90/59 (07/25 0900) SpO2:  [72 %-100 %] 100 % (07/25 0900) FiO2 (%):  [60 %-100 %] 60 % (07/25 0828) Weight:  [40.5 kg-54.4 kg] 40.5 kg (07/25 0412)  General: lying in bed, NAD CVS: pulse-normal rate and rhythm RS: breathing comfortably, intubated Extremities: normal, warm Neuro: propofol @5mcg /hr, doesn't open eyes to noxious stimulation, PERLA, corneal reflex intact, doesn't withdraw to noxious stimuli in bilateral lower extremities, withdraws to noxious stimuli with antigravity strength in bilateral lower extremities, increased tone in bilateral upper extremities  Basic Metabolic Panel: Recent Labs  Lab 03/17/2021 1608 03/21/21 0031 03/21/21 0245  NA 157* 159* 159*  K 4.8 4.7 4.9  CL 129*  --  129*  CO2 10*  --  14*  GLUCOSE 45*  --  164*  BUN 104*  --  108*  CREATININE 3.75*  --  3.89*  CALCIUM 6.9*  --  6.8*  MG  --   --  2.4  PHOS  --   --  6.2*    CBC: Recent Labs  Lab 03/04/2021 1608 03/21/21 0031 03/21/21 0245  WBC 8.5  --  13.7*  NEUTROABS 7.2  --   --   HGB 12.2 12.9 15.7*  HCT 40.8 38.0 50.5*  MCV 108.2*  --  102.2*  PLT 107*  --  PLATELET CLUMPS NOTED ON SMEAR, UNABLE TO ESTIMATE  PLATELET CLUMPS NOTED ON SMEAR, UNABLE TO ESTIMATE    Coagulation Studies: Recent Labs    03/21/2021 1608 03/21/21 0245  LABPROT 25.9* 25.4*  INR 2.4* 2.3*    Imaging CT head without contrast 03/17/2021: No acute intracranial abnormalities. Atrophy and chronic microvascular ischemic change stable from the recent prior study.   ASSESSMENT AND PLAN: 80 year old female with advanced dementia and on hospice at  assisted living facility, history of seizures on Keppra who was found unresponsive at her assisted living facility.  She received CPR briefly.  On arrival she was noted to have bilateral upper extremity twitching along with occasional facial twitching.  Suspected anoxic/hypoxic brain injury Intermittent twitching -EEG overnight did not show any evidence of epileptogenicity  Recommendation - I did not see any involuntary movements on my exam.   We will continue video EEG for about 24 hours for characterization but low suspicion for these movements to be epileptic.  Differentials include myoclonus due to toxic-metabolic encephalopathy -Continue Keppra at current dose -Recommend MRI brain without contrast to look for anoxic/hypoxic brain injury at 72 hours -We will discontinue propofol unless needed from critical care standpoint.  We will continue neuro exams off sedation to help with prognosis -Continue seizure precautions - Continue on IV Ativan 2 mg for clinical seizure-like activity -Management of rest of comorbidities per primary team -Plan was discussed with critical care team  CRITICAL CARE Performed by: 96   Total critical care time: Charlsie Quest  Critical care time was exclusive of separately billable procedures and treating other patients.  Critical care was necessary to treat or prevent imminent or life-threatening deterioration.  Critical care was time spent personally by me on the following activities: development of treatment plan with patient and/or surrogate as well  as nursing, discussions with consultants, evaluation of patient's response to treatment, examination of patient, obtaining history from patient or surrogate, ordering and performing treatments and interventions, ordering and review of laboratory studies, ordering and review of radiographic studies, pulse oximetry and re-evaluation of patient's condition.   Lindie Spruce [facility] For questions after 5pm please refer to AMION to reach the Neurologist on call

## 2021-03-21 NOTE — Progress Notes (Signed)
vLTM EEG started. Notified Neuro. Pt monitored 

## 2021-03-21 NOTE — Progress Notes (Signed)
PCCM Progress Note  Family present including husband Darcel Bayley) and two sons. I updated family regarding her critical illness. They shared with me how at baseline her quality of life seemed poor due to her dementia including her renal failure and septic shock. We discussed her living will including her wish to not be on life prolonging measures if the outcome were to worsen her quality of life. After discussion, decision was made to withdraw care and pursue comfort care measures.  Mechele Collin, M.D. [facility] Pulmonary/Critical Care Medicine 03/21/2021 2:23 PM   See Amion for personal pager For hours between 7 PM to 7 AM, please call Elink for urgent questions

## 2021-03-21 NOTE — Progress Notes (Signed)
LTM EEG discontinued - RN Boyd Kerbs will remove electrodes.

## 2021-03-23 LAB — URINE CULTURE: Culture: >=100000 — AB

## 2021-03-24 MED FILL — Ketamine HCl Soln Pref Syr 100 MG/10ML (10 MG/ML): INTRAMUSCULAR | Qty: 20 | Status: AC

## 2021-03-25 LAB — CULTURE, BLOOD (ROUTINE X 2): Culture: NO GROWTH

## 2021-03-28 NOTE — Progress Notes (Signed)
Notified by RN of time of death, relayed message to Dr. Benjamin Stain.

## 2021-03-28 NOTE — Death Summary Note (Addendum)
DEATH SUMMARY   Patient Details  Name: Mary Lynch MRN: [id number] DOB: 04/08/1941  Admission/Discharge Information   Admit Date:  03/02/2021  Date of Death: Date of Death: 2021-01-03  Time of Death: Time of Death: 0035  Length of Stay: 2  Referring Physician: Charlies Silversouillard, [name], PA-C   Reason(s) for Hospitalization  Cardiac arrest suspected secondary to urosepsis   Diagnoses  Preliminary cause of death:  Secondary Diagnoses (including complications and co-morbidities):  Active Problems:   Alzheimer's dementia, late onset, with behavioral disturbance (HCC)   Cardiopulmonary arrest (HCC)   UTI (urinary tract infection)   Septic shock (HCC)   AKI (acute kidney injury) (HCC)   Acute liver failure   Endotracheally intubated   DNR (do not resuscitate)   Anoxic encephalopathy (HCC)   Deep tissue injury   Pressure injury of skin   Hypernatremia   Metabolic acidosis   Acute respiratory failure with hypoxia (HCC)   History of seizure E.coli UTI Severe malnutrition  Brief Hospital Course (including significant findings, care, treatment, and services provided and events leading to death)  Mary Lynch is a 80 y.o. year old female who presented at [facility] after she found in unresponsive in cardiac arrest at her assisted living facility. CPR was initiated and subsequently transported to [facility] where she was intubated for agonal breathing and subsequently transported to [facility] for septic shock secondary to UTI. Started on Levophed and started on broad spectrum antibiotics. Found to be in multiorgan failure. Remained unresponsive on went when admitted to ICU. Withdrawing from pain and with myoclonic movements upper extremities. Acute metabolic encephalopathy suspected to be due to seizure vs anoxic brain injury. CT head without acute abnormalities. Spot EEG negative for stroke. Loaded on Keppra and continue on home keppra. Worsening AKI likely ATN in setting of  hypotension without improved despite fluid resuscitation. Also noted to be in acute liver failure with worsening transaminitis. Worsening hyponatremia.  Advanced dementia at baseline, husband and 2 sons notes she was been more confused (not understanding how to eat) in the past week prior to admission. Sedation discontinued without improvement in mental status. Minimal withdrawal from pain and moving less. Husband Mary Lynch and 2 sons visited and brought living will on 03/21/2021. Share that at baseline patient with advance dementia and unable to taker care of herself. Confirmed with family of her wish not to be on life prolonging measures that would worsen quality of life. Decision made with family to withdraw care and placed on comfort care. Patient was extubated for comfort on 03/21/2021. Family at bedside after extubation. She passed peacefully at 0035 on December 25, 2020, family notified.    Pertinent Labs and Studies  Significant Diagnostic Studies DG Abd 1 View  Result Date: 03/21/2021 CLINICAL DATA:  Status post OG tube placement. EXAM: ABDOMEN - 1 VIEW COMPARISON:  None. FINDINGS: OG tube is in place with the side port just above the stomach. Recommend advancement of 3-4 cm. IMPRESSION: As above. Electronically Signed   By: Drusilla Kannerhomas  Dalessio M.D.   On: 03/21/2021 11:21   CT Head Wo Contrast  Result Date: 02/28/2021 CLINICAL DATA:  Found down and unresponsive at the nursing home today. Patient intubated. EXAM: CT HEAD WITHOUT CONTRAST TECHNIQUE: Contiguous axial images were obtained from the base of the skull through the vertex without intravenous contrast. COMPARISON:  02/11/2021 FINDINGS: Brain: No evidence of acute infarction, hemorrhage, hydrocephalus, extra-axial collection or mass lesion/mass effect. There is ventricular sulcal enlargement reflecting moderate atrophy. Patchy white  matter hypoattenuation is also noted consistent with moderate chronic microvascular ischemic change. These findings are  stable. Vascular: No hyperdense vessel or unexpected calcification. Skull: Normal. Negative for fracture or focal lesion. Sinuses/Orbits: Globes and orbits are unremarkable. Sinuses are clear. Other: None. IMPRESSION: 1. No acute intracranial abnormalities. 2. Atrophy and chronic microvascular ischemic change stable from the recent prior study. Electronically Signed   By: Amie Portland M.D.   On: 2021/03/31 17:28   DG CHEST PORT 1 VIEW  Result Date: 03/21/2021 CLINICAL DATA:  Status post cardiac arrest EXAM: PORTABLE CHEST 1 VIEW COMPARISON:  Chest x-ray obtained yesterday 02/18/2021 FINDINGS: The patient remains intubated. The tip of the endotracheal tube is at the carina overriding the right mainstem bronchus. Gastric tube in unchanged position with the tip projecting over the left upper quadrant. The proximal side hole is likely in the region of the gastroesophageal junction. Cardiac and mediastinal contours remain within normal limits. Atherosclerotic calcifications present within the transverse aorta. No evidence of pulmonary edema, pleural effusion, pneumothorax or focal airspace infiltrate. IMPRESSION: 1. The tip of the endotracheal tube is at the carina overriding the right mainstem bronchus. Recommend withdrawing 5 cm for more optimal placement. 2. The tip of the gastric tube overlies the gastric bubble but the proximal side port is in the region of the gastroesophageal junction. Recommend advancing 3-5 cm for more optimal placement. 3. The lungs are clear. Electronically Signed   By: Malachy Moan M.D.   On: 03/21/2021 13:07   DG Chest Port 1 View  Result Date: 03/31/2021 CLINICAL DATA:  Intubation EXAM: PORTABLE CHEST 1 VIEW COMPARISON:  None. FINDINGS: Support Apparatus: --Endotracheal tube: Tip is just above the carina. Recommend retracting by 4 cm. --Enteric tube:Side port in the distal esophagus. Recommend advancing by 10-12 cm. --Vascular catheter(s):None --Other: None The heart size and  mediastinal contours are within normal limits. The lungs are clear. No pleural effusion or pneumothorax. IMPRESSION: 1. Recommend retracting endotracheal tube by 4 cm. 2. Enteric tube side port in the distal esophagus. Recommend advancing by 10-12 cm. 3. Clear lungs. Electronically Signed   By: Deatra Robinson M.D.   On: Mar 31, 2021 22:21   DG Chest Port 1 View  Result Date: March 31, 2021 CLINICAL DATA:  Status post intubation. EXAM: PORTABLE CHEST 1 VIEW COMPARISON:  February 11, 2021. FINDINGS: The heart size and mediastinal contours are within normal limits. Endotracheal tube is in grossly good position. Both lungs are clear. The visualized skeletal structures are unremarkable. IMPRESSION: Endotracheal tube in grossly good position. No acute cardiopulmonary abnormality seen. Electronically Signed   By: Lupita Raider M.D.   On: 03/31/21 16:02   Overnight EEG with video  Result Date: 03/21/2021 Charlsie Quest, MD     03/21/2021 10:33 AM Patient Name: Jenisis Harmsen. Modesitt MRN: [id number] Epilepsy Attending: Charlsie Quest Referring Physician/Provider: Dr Erick Blinks Duration: 2021/03/31 2308 to 03/21/2021 0733 Patient history: 80 year old female with history of seizures on Keppra presented with altered mental status and noted to have twitching in bilateral upper extremities.  EEG to evaluate for seizures. Level of alertness: Lethargic AEDs during EEG study: LEV, propofol Technical aspects: This EEG was obtained using a 10 lead EEG system positioned circumferentially without any parasagittal coverage (rapid EEG). Computer selected EEG is reviewed as  well as background features and all clinically significant events. Description: No clear posterior dominant rhythm was seen.  EEG showed consider generalized 3 to 5Hz  theta- delta slowing.  Hyperventilation and photic stimulation were not performed.  ABNORMALITY - Continuous slow, generalized IMPRESSION: This limited ceribell EEG is suggestive of moderate to  severe diffuse encephalopathy, nonspecific etiology. No seizures or epileptiform discharges were seen throughout the recording. Charlsie Quest   ECHOCARDIOGRAM COMPLETE  Result Date: 03/21/2021   Final     Microbiology Recent Results (from the past 240 hour(s))  Resp Panel by RT-PCR (Flu A&B, Covid) Nasopharyngeal Swab     Status: None   Collection Time: 03/26/2021  3:25 PM   Specimen: Nasopharyngeal Swab; Nasopharyngeal(NP) swabs in vial transport medium  Result Value Ref Range Status   SARS Coronavirus 2 by RT PCR NEGATIVE NEGATIVE Final    Comment: (NOTE) SARS-CoV-2 target nucleic acids are NOT DETECTED.  The SARS-CoV-2 RNA is generally detectable in upper respiratory specimens during the acute phase of infection. The lowest concentration of SARS-CoV-2 viral copies this assay can detect is 138 copies/mL. A negative result does not preclude SARS-Cov-2 infection and should not be used as the sole basis for treatment or other patient management decisions. A negative result may occur with  improper specimen collection/handling, submission of specimen other than nasopharyngeal swab, presence of viral mutation(s) within the areas targeted by this assay, and inadequate number of viral copies(<138 copies/mL). A negative result must be combined with clinical observations, patient history, and epidemiological information. The expected result is Negative.  Fact Sheet for Patients:  BloggerCourse.com  Fact Sheet for Healthcare Providers:  SeriousBroker.it  This test is no t yet approved or cleared by the [place] FDA and  has been authorized for detection and/or diagnosis of SARS-CoV-2 by FDA under an Emergency Use Authorization (EUA). This EUA will remain  in effect (meaning this test can be used) for the duration of the COVID-19 declaration under Section 564(b)(1) of the Act, 21 U.S.C.section 360bbb-3(b)(1), unless the  authorization is terminated  or revoked sooner.       Influenza A by PCR NEGATIVE NEGATIVE Final   Influenza B by PCR NEGATIVE NEGATIVE Final    Comment: (NOTE) The Xpert Xpress SARS-CoV-2/FLU/RSV plus assay is intended as an aid in the diagnosis of influenza from Nasopharyngeal swab specimens and should not be used as a sole basis for treatment. Nasal washings and aspirates are unacceptable for Xpert Xpress SARS-CoV-2/FLU/RSV testing.  Fact Sheet for Patients: BloggerCourse.com  Fact Sheet for Healthcare Providers: SeriousBroker.it  This test is not yet approved or cleared by the [place] FDA and has been authorized for detection and/or diagnosis of SARS-CoV-2 by FDA under an Emergency Use Authorization (EUA). This EUA will remain in effect (meaning this test can be used) for the duration of the COVID-19 declaration under Section 564(b)(1) of the Act, 21 U.S.C. section 360bbb-3(b)(1), unless the authorization is terminated or revoked.  Performed at [facility], [address], [place], [place] [address]   Urine Culture     Status: Abnormal   Collection Time: 03/25/2021  3:26 PM   Specimen: In/Out Cath Urine  Result Value Ref Range Status   Specimen Description   Final    IN/OUT CATH URINE Performed at [facility], [address], [place], [place] [address]    Special Requests   Final    NONE Performed at [facility], [address], [place], [place] [address]    Culture >=100,000 COLONIES/mL ESCHERICHIA COLI (A)  Final   Report Status 03/23/2021 FINAL  Final   Organism ID, Bacteria ESCHERICHIA COLI (A)  Final      Susceptibility   Escherichia coli - MIC*  AMPICILLIN 8 SENSITIVE Sensitive     CEFAZOLIN <=4 SENSITIVE Sensitive     CEFEPIME <=0.12 SENSITIVE Sensitive     CEFTRIAXONE <=0.25 SENSITIVE Sensitive     CIPROFLOXACIN >=4 RESISTANT Resistant     GENTAMICIN <=1 SENSITIVE Sensitive     IMIPENEM <=0.25  SENSITIVE Sensitive     NITROFURANTOIN <=16 SENSITIVE Sensitive     TRIMETH/SULFA <=20 SENSITIVE Sensitive     AMPICILLIN/SULBACTAM 4 SENSITIVE Sensitive     PIP/TAZO <=4 SENSITIVE Sensitive     * >=100,000 COLONIES/mL ESCHERICHIA COLI  Blood Culture (routine x 2)     Status: None (Preliminary result)   Collection Time: 03/25/2021  4:08 PM   Specimen: BLOOD  Result Value Ref Range Status   Specimen Description BLOOD  Final   Special Requests NONE  Final   Culture   Final    NO GROWTH 3 DAYS Performed at [facility], [address] [place], [place] [address]    Report Status PENDING  Incomplete  MRSA Next Gen by PCR, Nasal     Status: Abnormal   Collection Time: 03/21/2021  9:31 PM   Specimen: Nasal Mucosa; Nasal Swab  Result Value Ref Range Status   MRSA by PCR Next Gen DETECTED (A) NOT DETECTED Final    Comment: RESULT CALLED TO, READ BACK BY AND VERIFIED WITH: Starla Link RN 03/21/2021 2323 JDW (NOTE) The GeneXpert MRSA Assay (FDA approved for NASAL specimens only), is one component of a comprehensive MRSA colonization surveillance program. It is not intended to diagnose MRSA infection nor to guide or monitor treatment for MRSA infections. Test performance is not FDA approved in patients less than 28 years old. Performed at [facility], [address], [place], [place] [address]     Lab Basic Metabolic Panel: Recent Labs  Lab 03/15/2021 1608 03/21/21 0031 03/21/21 0245 03/21/21 1043  NA 157* 159* 159* 161*  K 4.8 4.7 4.9 4.7  CL 129*  --  129*  --   CO2 10*  --  14*  --   GLUCOSE 45*  --  164*  --   BUN 104*  --  108*  --   CREATININE 3.75*  --  3.89*  --   CALCIUM 6.9*  --  6.8*  --   MG  --   --  2.4  --   PHOS  --   --  6.2*  --    Liver Function Tests: Recent Labs  Lab 03/06/2021 1608 03/21/21 0245  AST 932* 7,178*  ALT 905* 4,230*  ALKPHOS 67 92  BILITOT 1.3* 1.9*  PROT 3.9* 5.0*  ALBUMIN 2.1* 2.6*   No results for input(s): LIPASE, AMYLASE in the  last 168 hours. Recent Labs  Lab 03/21/21 0245  AMMONIA 70*   CBC: Recent Labs  Lab 03/17/2021 1608 03/21/21 0031 03/21/21 0245 03/21/21 1043  WBC 8.5  --  13.7*  --   NEUTROABS 7.2  --   --   --   HGB 12.2 12.9 15.7* 13.9  HCT 40.8 38.0 50.5* 41.0  MCV 108.2*  --  102.2*  --   PLT 107*  --  PLATELET CLUMPS NOTED ON SMEAR, UNABLE TO ESTIMATE  PLATELET CLUMPS NOTED ON SMEAR, UNABLE TO ESTIMATE  --    Cardiac Enzymes: No results for input(s): CKTOTAL, CKMB, CKMBINDEX, TROPONINI in the last 168 hours. Sepsis Labs: Recent Labs  Lab 03/27/2021 1608 03/27/2021 1742 03/21/21 0245 03/21/21 1037  WBC 8.5  --  13.7*  --  LATICACIDVEN >11.0* 10.2* 6.2* 4.8*    Procedures/Operations  Intubated on 03/07/2021 at [facility] ED EEG with diffuse encephalopathy, no seizures     Quincy Simmonds 03/23/2021, 1:09 PM  I have reviewed the chart and agree with the residents note, impression as outlined.   Mechele Collin, M.D. [facility] Pulmonary/Critical Care Medicine

## 2021-03-28 NOTE — Progress Notes (Signed)
Patient was pronounced dead at 00:35 by myself and Christene Lye, RN. No lung sounds or heart beat were auscultated.Physician was notified as well as family. Post mortem care was completed. Patient was taken down to morgue. Family has decided to come in the morning and make further decisions about patient funeral home arrangements. [employer] has been contacted and Stefanie cofnirmed that patient was a full release.

## 2021-03-28 DEATH — deceased

## 2021-09-14 IMAGING — CT CT HEAD W/O CM
4 series · 17 of 47 positions shown, 19 images · non-contrast
Comparison: 02/11/2021

CLINICAL DATA: Found down and unresponsive at the [HOSPITAL]
today. Patient intubated.

EXAM:
CT HEAD WITHOUT CONTRAST
TECHNIQUE: Contiguous axial images were obtained from the base of the skull
through the vertex without intravenous contrast.

[Series 2: head w o · axial · 0.48mm/px · z∈[-10,+105]mm · 6 of 33 slices shown, 8 images]
[im 5/33  brain]
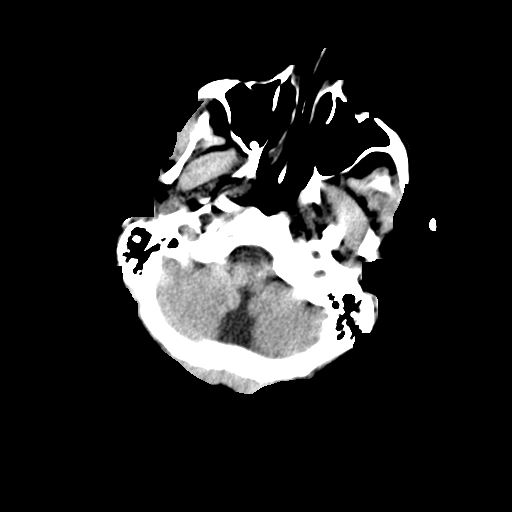
[im 5/33  bone]
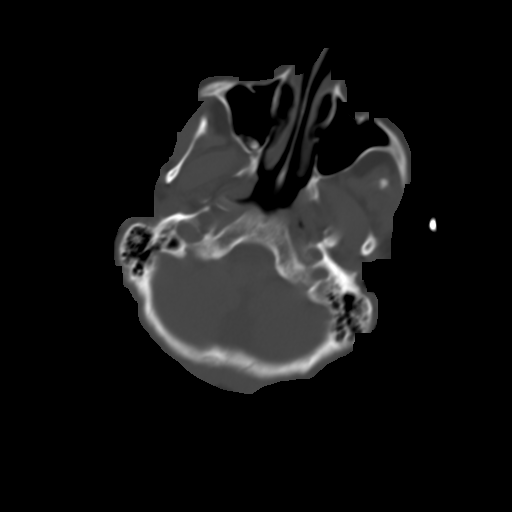
[im 10/33  brain]
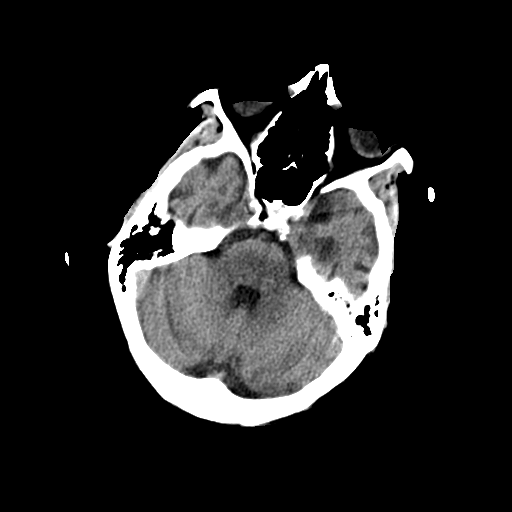
[im 14/33  brain]
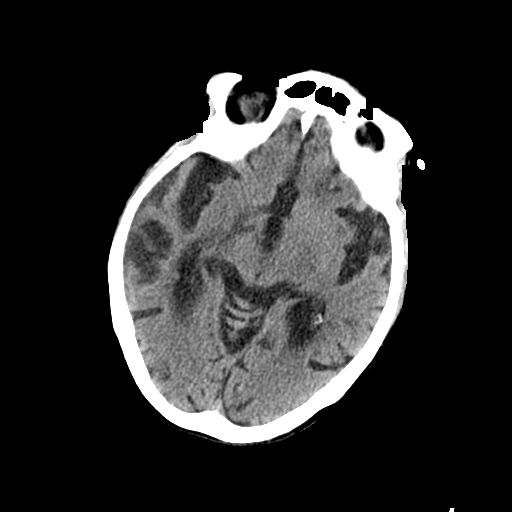
[im 19/33  brain]
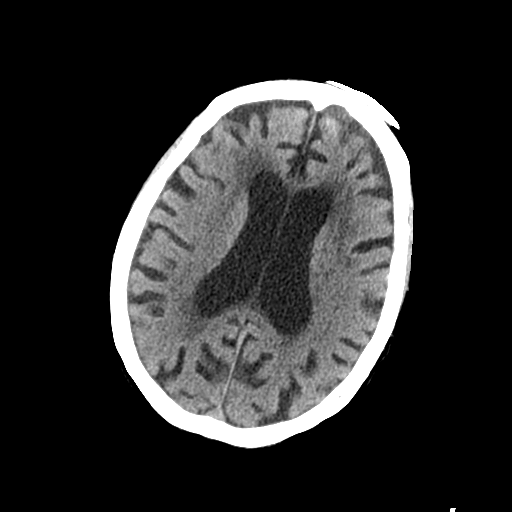
[im 23/33  brain]
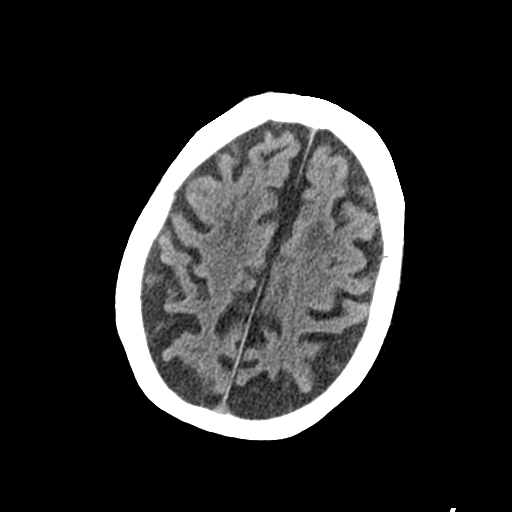
[im 23/33  bone]
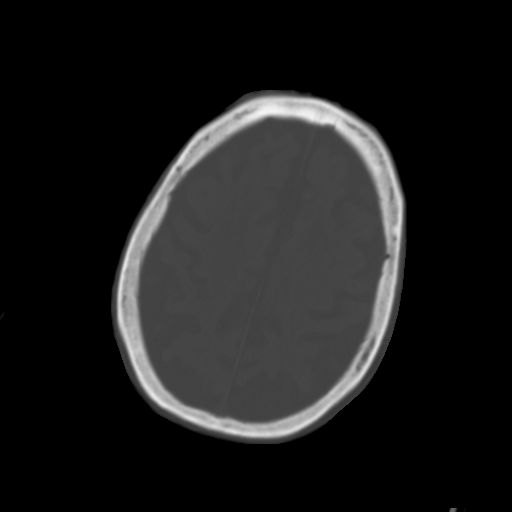
[im 28/33  brain]
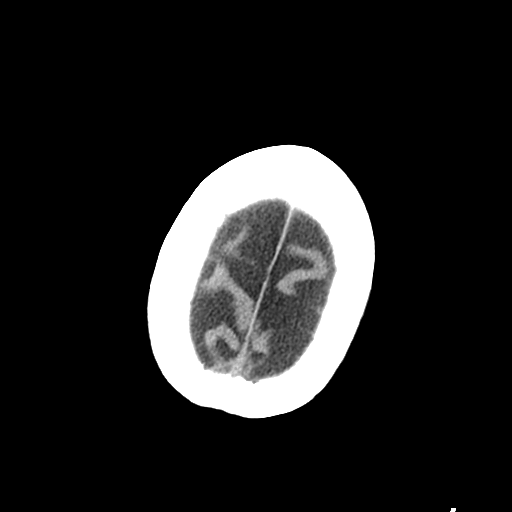

[Series 3: head bone · axial · 0.48mm/px · z∈[-14,+66]mm · 5 of 85 slices shown]
[im 9/85  bone]
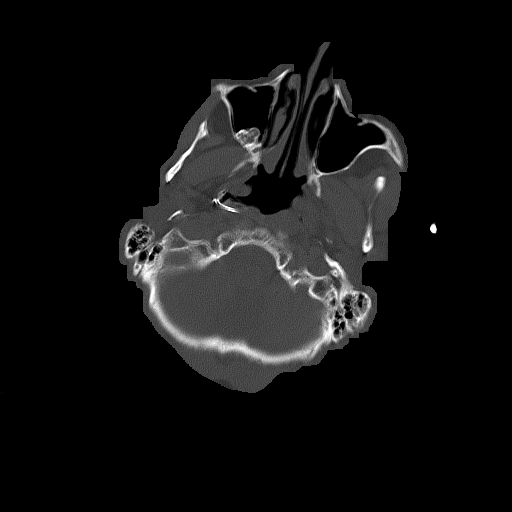
[im 17/85  bone]
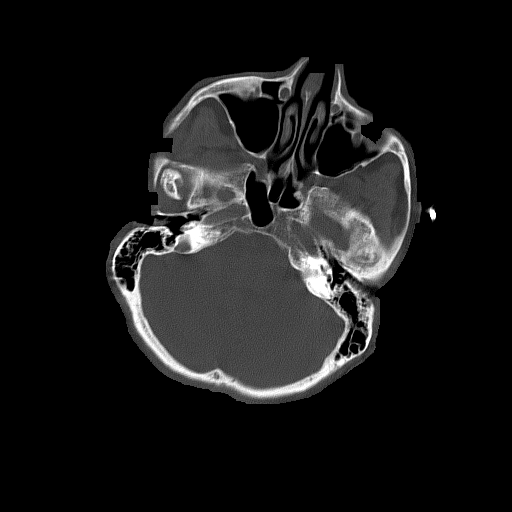
[im 29/85  bone]
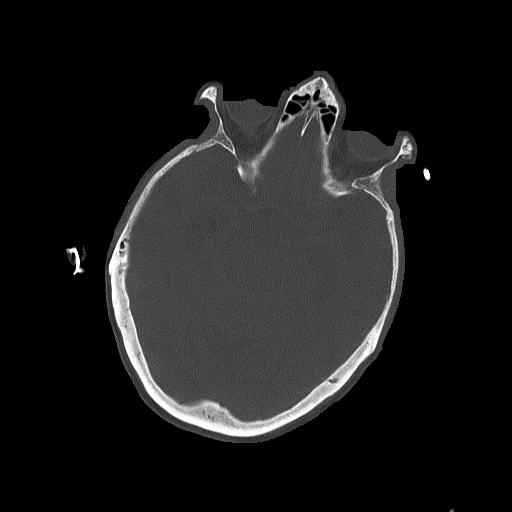
[im 37/85  bone]
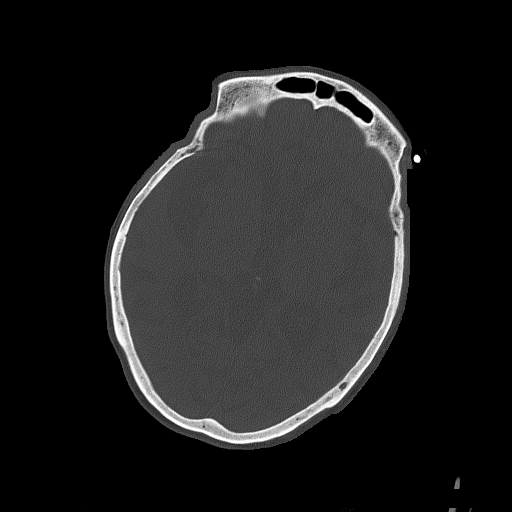
[im 49/85  bone]
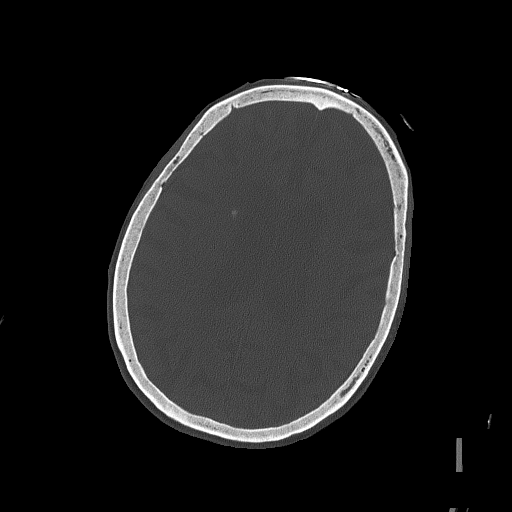

[Series 4: coronal soft · coronal · 0.33mm/px · 3 of 69 slices shown]
[im 23/69  brain]
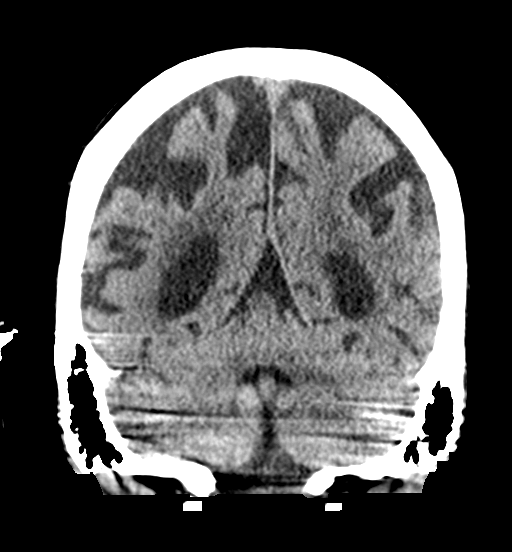
[im 31/69  brain]
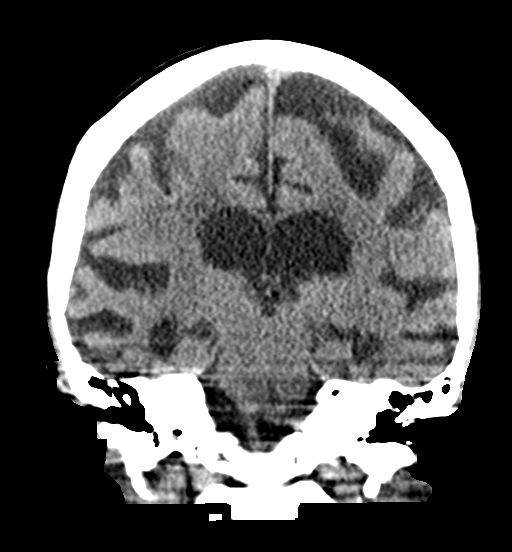
[im 38/69  brain]
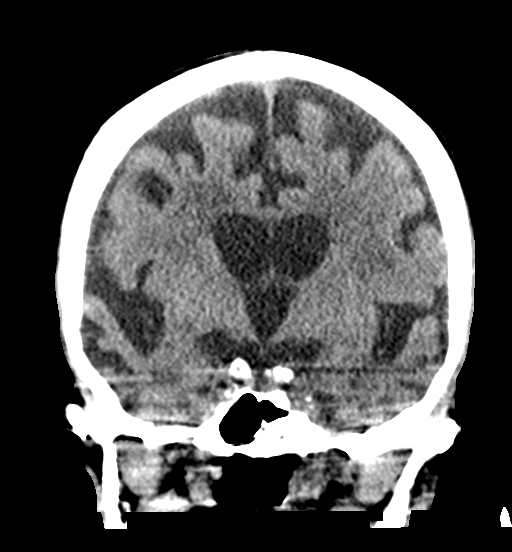

[Series 5: sagittal soft · sagittal · 0.35mm/px · 3 of 52 slices shown]
[im 18/52  brain]
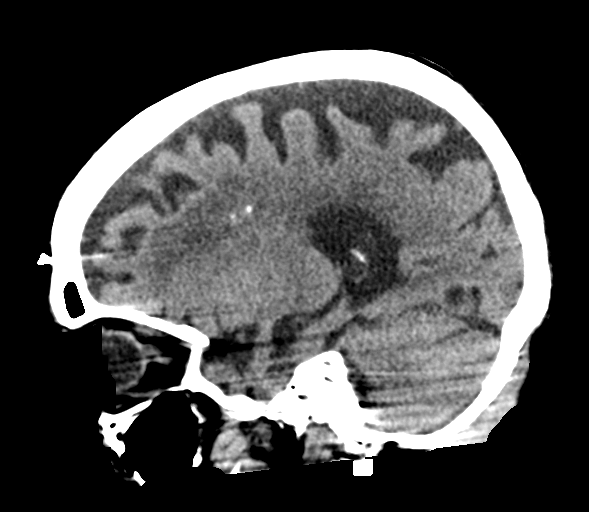
[im 26/52  brain]
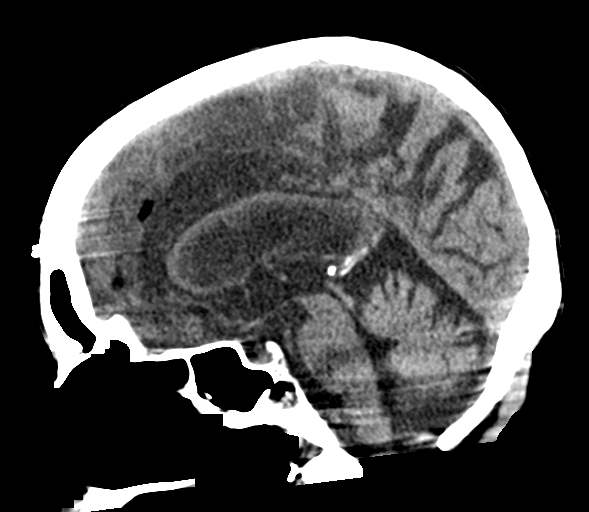
[im 35/52  brain]
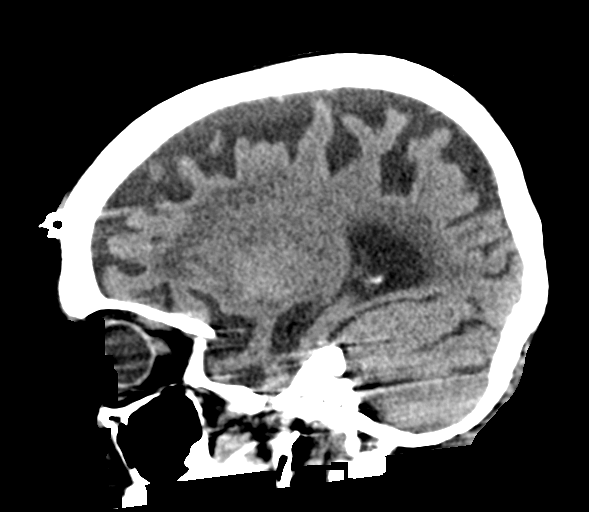

[17 of 47 positions shown; findings below may reference images not displayed]

FINDINGS: Brain: No evidence of acute infarction, hemorrhage, hydrocephalus,
extra-axial collection or mass lesion/mass effect.

There is ventricular sulcal enlargement reflecting moderate atrophy.
Patchy white matter hypoattenuation is also noted consistent with
moderate chronic microvascular ischemic change. These findings are
stable.

Vascular: No hyperdense vessel or unexpected calcification.

Skull: Normal. Negative for fracture or focal lesion.

Sinuses/Orbits: Globes and orbits are unremarkable. Sinuses are
clear.

Other: None.
IMPRESSION: 1. No acute intracranial abnormalities.
2. Atrophy and chronic microvascular ischemic change stable from the
recent prior study.

## 2021-09-15 IMAGING — DX DG CHEST 1V PORT
1 series · 1 of 1 positions shown · non-contrast
Comparison: Chest x-ray obtained yesterday 02/18/2021

CLINICAL DATA: Status post cardiac arrest

EXAM:
PORTABLE CHEST 1 VIEW

[chest ap]
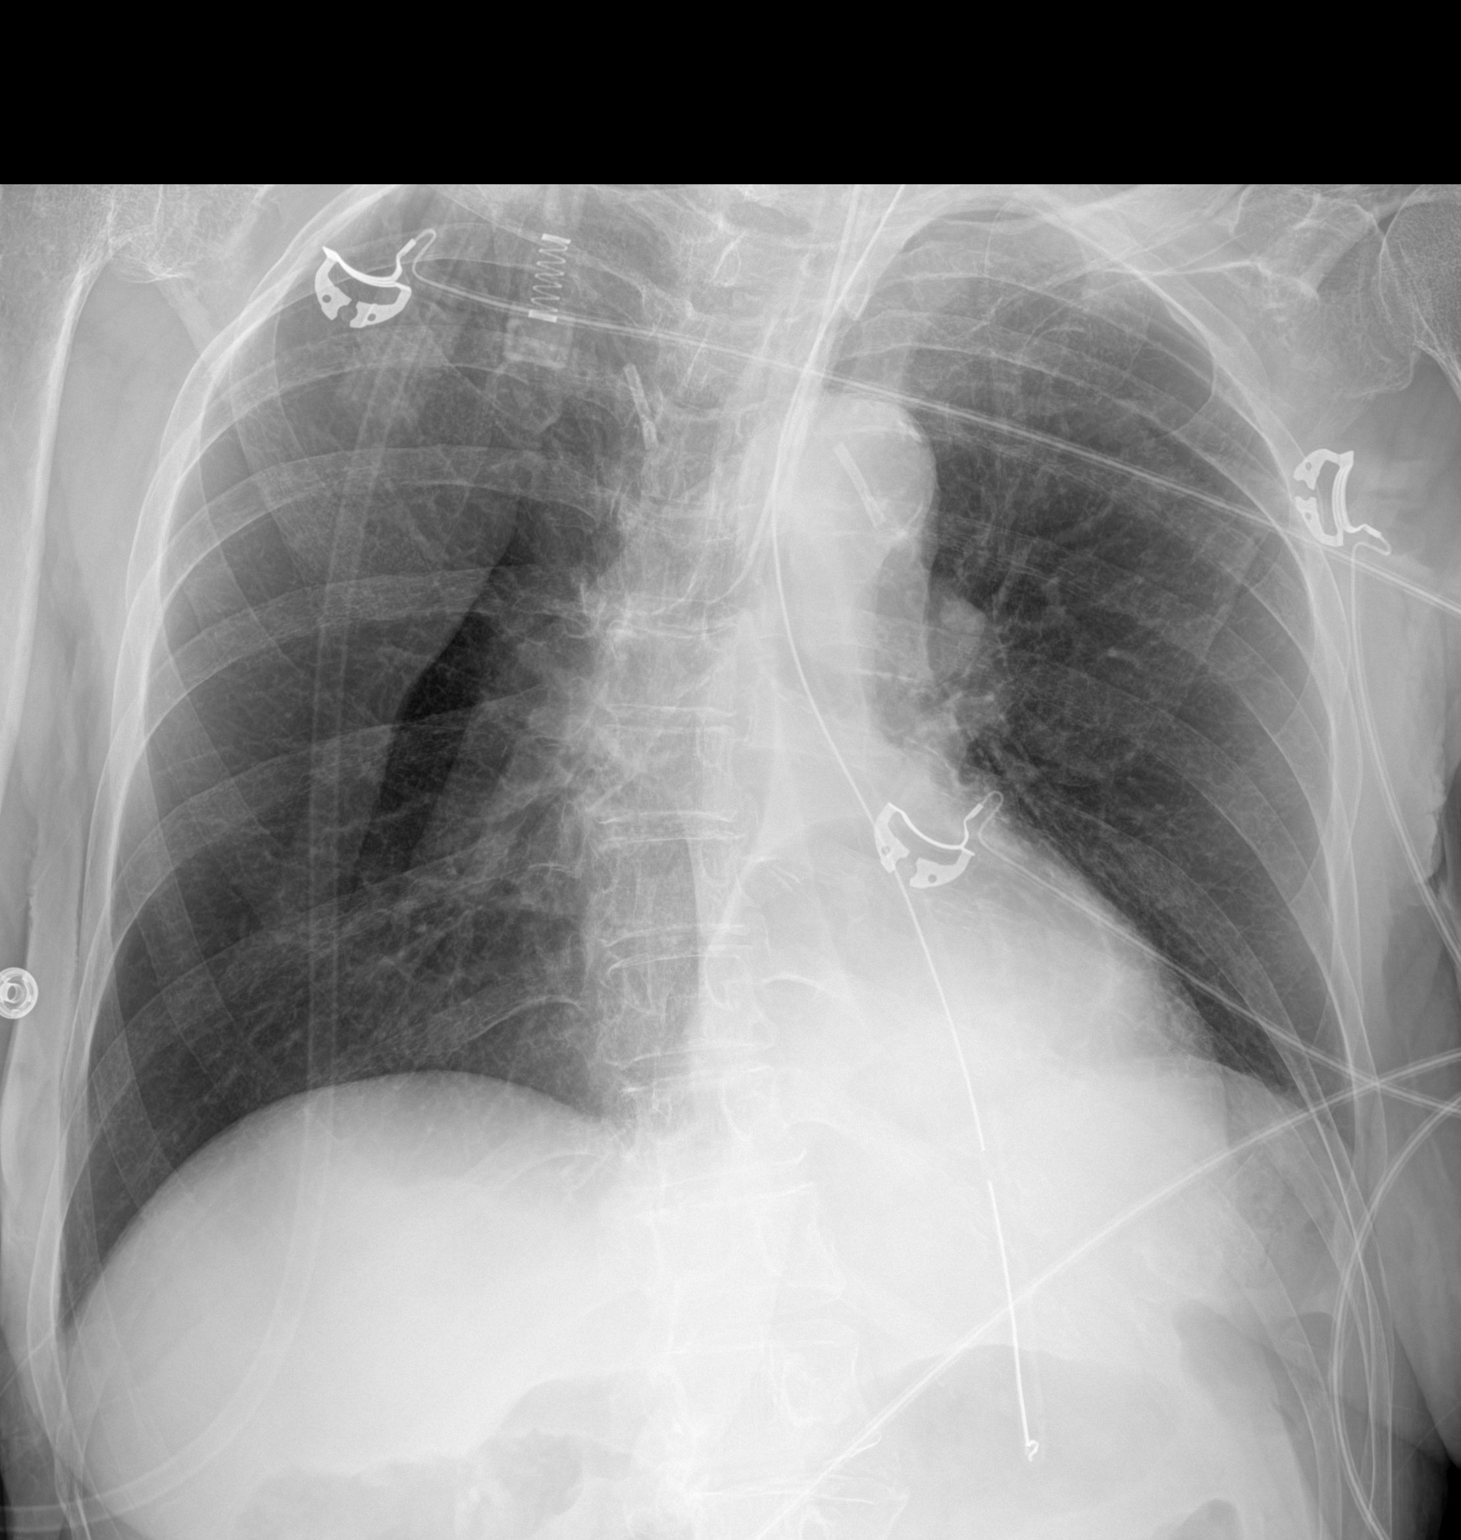

[1 of 1 positions shown; findings below may reference images not displayed]

FINDINGS: The patient remains intubated. The tip of the endotracheal tube is
at the carina overriding the right mainstem bronchus. Gastric tube
in unchanged position with the tip projecting over the left upper
quadrant. The proximal side hole is likely in the region of the
gastroesophageal junction. Cardiac and mediastinal contours remain
within normal limits. Atherosclerotic calcifications present within
the transverse aorta. No evidence of pulmonary edema, pleural
effusion, pneumothorax or focal airspace infiltrate.
IMPRESSION: 1. The tip of the endotracheal tube is at the carina overriding the
right mainstem bronchus. Recommend withdrawing 5 cm for more optimal
placement.
2. The tip of the gastric tube overlies the gastric bubble but the
proximal side port is in the region of the gastroesophageal
junction. Recommend advancing 3-5 cm for more optimal placement.
3. The lungs are clear.
